# Patient Record
Sex: Female | Born: 1965
Health system: Southern US, Community
[De-identification: ages and names within clinical notes are randomized; demographics above are authoritative.]

## PROBLEM LIST (undated history)

## (undated) DIAGNOSIS — I471 Supraventricular tachycardia, unspecified: Secondary | ICD-10-CM

## (undated) HISTORY — PX: BREAST SURGERY: SHX581

## (undated) HISTORY — DX: Supraventricular tachycardia, unspecified: I47.10

## (undated) HISTORY — PX: ANTERIOR CRUCIATE LIGAMENT REPAIR: SHX115

---

## 1998-02-24 ENCOUNTER — Emergency Department (HOSPITAL_COMMUNITY): Admission: EM | Admit: 1998-02-24 | Discharge: 1998-02-24 | Payer: Self-pay | Admitting: Emergency Medicine

## 1998-02-24 ENCOUNTER — Encounter: Payer: Self-pay | Admitting: Emergency Medicine

## 1998-05-29 ENCOUNTER — Other Ambulatory Visit: Admission: RE | Admit: 1998-05-29 | Discharge: 1998-05-29 | Payer: Self-pay

## 1999-03-04 ENCOUNTER — Emergency Department (HOSPITAL_COMMUNITY): Admission: EM | Admit: 1999-03-04 | Discharge: 1999-03-04 | Payer: Self-pay | Admitting: Emergency Medicine

## 1999-05-28 ENCOUNTER — Other Ambulatory Visit: Admission: RE | Admit: 1999-05-28 | Discharge: 1999-05-28 | Payer: Self-pay | Admitting: Obstetrics and Gynecology

## 2002-09-26 ENCOUNTER — Emergency Department (HOSPITAL_COMMUNITY): Admission: EM | Admit: 2002-09-26 | Discharge: 2002-09-26 | Payer: Self-pay | Admitting: Emergency Medicine

## 2003-03-20 ENCOUNTER — Other Ambulatory Visit: Admission: RE | Admit: 2003-03-20 | Discharge: 2003-03-20 | Payer: Self-pay | Admitting: *Deleted

## 2003-03-20 ENCOUNTER — Other Ambulatory Visit: Admission: RE | Admit: 2003-03-20 | Discharge: 2003-03-20 | Payer: Self-pay | Admitting: Obstetrics and Gynecology

## 2003-05-15 ENCOUNTER — Ambulatory Visit (HOSPITAL_COMMUNITY): Admission: RE | Admit: 2003-05-15 | Discharge: 2003-05-15 | Payer: Self-pay | Admitting: Obstetrics and Gynecology

## 2003-10-18 ENCOUNTER — Inpatient Hospital Stay (HOSPITAL_COMMUNITY): Admission: AD | Admit: 2003-10-18 | Discharge: 2003-10-21 | Payer: Self-pay | Admitting: Obstetrics and Gynecology

## 2004-02-26 ENCOUNTER — Ambulatory Visit (HOSPITAL_COMMUNITY): Admission: RE | Admit: 2004-02-26 | Discharge: 2004-02-26 | Payer: Self-pay | Admitting: Plastic Surgery

## 2004-03-20 ENCOUNTER — Emergency Department (HOSPITAL_COMMUNITY): Admission: EM | Admit: 2004-03-20 | Discharge: 2004-03-20 | Payer: Self-pay | Admitting: Emergency Medicine

## 2004-08-12 ENCOUNTER — Other Ambulatory Visit: Admission: RE | Admit: 2004-08-12 | Discharge: 2004-08-12 | Payer: Self-pay | Admitting: Obstetrics and Gynecology

## 2005-09-09 ENCOUNTER — Other Ambulatory Visit: Admission: RE | Admit: 2005-09-09 | Discharge: 2005-09-09 | Payer: Self-pay | Admitting: Obstetrics and Gynecology

## 2005-12-03 ENCOUNTER — Emergency Department (HOSPITAL_COMMUNITY): Admission: EM | Admit: 2005-12-03 | Discharge: 2005-12-04 | Payer: Self-pay | Admitting: Emergency Medicine

## 2005-12-15 ENCOUNTER — Emergency Department (HOSPITAL_COMMUNITY): Admission: EM | Admit: 2005-12-15 | Discharge: 2005-12-15 | Payer: Self-pay | Admitting: Family Medicine

## 2006-04-01 ENCOUNTER — Emergency Department (HOSPITAL_COMMUNITY): Admission: EM | Admit: 2006-04-01 | Discharge: 2006-04-01 | Payer: Self-pay | Admitting: Emergency Medicine

## 2006-07-29 ENCOUNTER — Emergency Department (HOSPITAL_COMMUNITY): Admission: EM | Admit: 2006-07-29 | Discharge: 2006-07-29 | Payer: Self-pay | Admitting: Emergency Medicine

## 2007-10-25 ENCOUNTER — Ambulatory Visit (HOSPITAL_COMMUNITY): Admission: RE | Admit: 2007-10-25 | Discharge: 2007-10-25 | Payer: Self-pay | Admitting: Obstetrics and Gynecology

## 2008-11-01 ENCOUNTER — Ambulatory Visit (HOSPITAL_COMMUNITY): Admission: RE | Admit: 2008-11-01 | Discharge: 2008-11-01 | Payer: Self-pay | Admitting: Obstetrics & Gynecology

## 2010-03-31 ENCOUNTER — Ambulatory Visit (HOSPITAL_COMMUNITY): Admission: RE | Admit: 2010-03-31 | Discharge: 2010-03-31 | Payer: Self-pay | Admitting: Obstetrics and Gynecology

## 2010-07-06 ENCOUNTER — Encounter: Payer: Self-pay | Admitting: Obstetrics and Gynecology

## 2010-10-31 NOTE — Op Note (Signed)
NAME:  Sue Shields, Sue Shields                    ACCOUNT NO.:  [id number]   MEDICAL RECORD NO.:  [id number]                   PATIENT TYPE:  INP   LOCATION:  9138                                 FACILITY:  [facility]   PHYSICIAN:  Janine Limbo, M.D.            DATE OF BIRTH:  21-Mar-1966   DATE OF PROCEDURE:  10/18/2003  DATE OF DISCHARGE:                                 OPERATIVE REPORT   PREOPERATIVE DIAGNOSES:  1. Term intrauterine pregnancy.  2. Gestational hypertension.  3. Herpes simplex virus outbreak.   POSTOPERATIVE DIAGNOSES:  1. Term intrauterine pregnancy.  2. Gestational hypertension.  3. Herpes simplex virus outbreak.   1. Macrosomia.   PROCEDURE:  Primary low transverse cesarean section.   SURGEON:  Janine Limbo, M.D.   FIRST ASSISTANT:  Concha Pyo. Duplantis, C.N.M.   ANESTHESIA:  Spinal anesthesia.   INDICATIONS FOR PROCEDURE:  Ms. Sue Shields is a 45 year old female,  gravida 1, para 0, who presents at 40 weeks and three days gestation.  She  has been followed at the [facility] of [facility].  This pregnancy has been largely uncomplicated.  The  patient did develop gestational hypertension at the end of her pregnancy.  She has a history of herpes simplex virus and she did have an outbreak  several days ago.  The patient has a history of depression.  The patient  understands the indications for her procedure and she accepts the risks of,  but not limited to, anesthetic complications, bleeding, infections, and  possible damage to the surrounding organs.  The patient elected not to  prolong pregnancy any longer in hopes of resolving the herpes outbreak.   FINDINGS:  A 9 pound 13 ounce female infant Huston Foley) was delivered from the  cephalic presentation. The Apgars were 8 at one minute and 9 at five  minutes.  The uterus, fallopian tubes, and the ovaries were normal for the  gravid state.   DESCRIPTION OF  PROCEDURE:  The patient was taken to the operating room where  a spinal anesthetic was given.  The patient's abdomen and peritoneum were  prepped with multiple layers of Betadine. A  Foley catheter was placed in  the bladder.  The patient was sterilely draped.  The lower abdomen was  injected with 16 mL of 0.5% Marcaine.  A low transverse incision was made  and the incision was carried sharply through the subcutaneous tissue, the  fascia, and the anterior peritoneum.  An incision was made in the lower  uterine segment and the bladder flap was developed.  The incision was  extended in a low transverse fashion on the uterus.  The fetal head was  delivered without difficulty.  A nuchal cord was present.  The remainder of  the infant was delivered.  The cord was clamped and cut and the infant was  handed to the awaiting pediatric team.  Routine cord blood studies were  obtained.  The placenta was removed.  The uterine cavity was cleaned of  amniotic fluid, clotted blood and membranes.  The uterine incision was  closed using a running locking suture of 2-0 Vicryl followed by an  imbricating suture of 2-0 Vicryl.  Hemostasis was adequate.  The pericolonic  gutters were vigorously irrigated. The anterior peritoneum and the abdominal  musculature were reapproximated in the midline using 2-0 Vicryl.  The fascia  was closed using a running suture of 0 Vicryl followed by three interrupted  sutures of 0 Vicryl.  The subcutaneous layer was irrigated.  Hemostasis was  adequate.  A Jackson-Pratt drain was placed in the subcutaneous space and  brought out through the left lower quadrant.  The subcutaneous layer was  closed using 0 Vicryl and the skin was reapproximated using a subcuticular  suture of 3-0 Monopril.  The Jackson-Pratt drain was sutured into place  using 2-0 silk.  Sponge, needle and instrument counts were correct x2.   ESTIMATED BLOOD LOSS:  800 mL.   The patient tolerated the procedure  well.  The patient was taken to the  recovery room in stable condition.  The infant was taken to the full-term  nursery in stable condition.  The patient was noted to drain clear, yellow  urine at the end of her procedure.                                               Janine Limbo, M.D.    AVS/MEDQ  D:  10/18/2003  T:  10/19/2003  Job:  [id number]
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
## 2010-10-31 NOTE — Discharge Summary (Signed)
NAME:  Sue Shields, Sue Shields                    ACCOUNT NO.:  [id number]   MEDICAL RECORD NO.:  [id number]                   PATIENT TYPE:  INP   LOCATION:  9138                                 [facility]ILITY[facility]   PHYSICIAN:  Crist Fat. Rivard, M.D.              DATE OF BIRTH:  [birth year]   DATE OF ADMISSION:  10/18/2003  DATE OF DISCHARGE:  10/21/2003                                 DISCHARGE SUMMARY   ADMISSION DIAGNOSES:  1. Intrauterine pregnancy at term.  2. Gestational hypertension.  3. HSV outbreak.   PROCEDURE:  Primary low transverse Cesarean section.   DISCHARGE DIAGNOSES:  1. Intrauterine pregnancy at term.  2. Gestational hypertension.  3. HSV outbreak.  4. Primary low transverse cesarean section.  5. Macrosomia.   HISTORY OF PRESENT ILLNESS:  Ms. Sue Shields is a 45 year old gravida 1,  para 0 at 40-3/7 weeks who presents following evaluation at the office of  [facility] for HSV outbreak. She  has had some gestational hypertension and in light of the fact that she is  at her due date, decision has been made for primary low transverse cesarean  section at this time. This was discussed with the patient and accepted by  her. On Oct 18, 2003 a primary low transverse cesarean section was performed  by Dr. Marline Backbone with the birth of a 9 pound 13 ounce female infant  named Sue Shields with Apgar scores of 8 at 1 minute and 9 at 5 minutes. The  patient has done well in the postoperative period. Her incision is clean,  dry and intact. Jackson-Pratt drain was removed on the second postoperative  day. Her vital signs have remained stable. She is afebrile. Her hemoglobin  on the first postoperative day was 10.9. Her baby has remained stable and is  breast and bottle feeding. On this, her third postoperative day, she is said  to be in satisfactory condition for discharge.   DISCHARGE INSTRUCTIONS:  Per [facility]  Gynecologic  Services handout.   DISCHARGE MEDICATIONS:  1. Motrin 600 mg p.o. q. 6 hours p.r.n. pain.  2. Tylox 1 to 2 p.o. q. 3 to 4 hours pain.  3. Prenatal vitamins.  4. The patient will start Ortho-Evra for contraception in 2 to 4 weeks.   FOLLOW UP:  The patient will be seen at 6 weeks postpartum at the office of  [facility].    Rica Koyanagi, C.N.M.               Crist Fat Rivard, M.D.   SDM/MEDQ  D:  10/21/2003  T:  10/21/2003  Job:  [id number]

## 2010-12-10 ENCOUNTER — Other Ambulatory Visit (HOSPITAL_COMMUNITY): Payer: Self-pay | Admitting: Obstetrics and Gynecology

## 2010-12-10 ENCOUNTER — Ambulatory Visit (HOSPITAL_COMMUNITY)
Admission: RE | Admit: 2010-12-10 | Discharge: 2010-12-10 | Disposition: A | Discharge status: Home / Self Care | Payer: No Typology Code available for payment source | Source: Ambulatory Visit | Attending: Obstetrics and Gynecology | Admitting: Obstetrics and Gynecology

## 2010-12-10 DIAGNOSIS — N970 Female infertility associated with anovulation: Secondary | ICD-10-CM

## 2010-12-10 DIAGNOSIS — N979 Female infertility, unspecified: Secondary | ICD-10-CM | POA: Insufficient documentation

## 2010-12-11 ENCOUNTER — Ambulatory Visit (HOSPITAL_COMMUNITY): Payer: Self-pay

## 2011-10-14 IMAGING — RF DG HYSTEROGRAM
6 series · 15 of 15 positions shown · IV contrast (omnipaque)
Comparison: none

CLINICAL DATA: Infertility

HYSTEROSALPINGOGRAM
TECHNIQUE: Following cleansing of the cervix and vagina with
Betadine solution, a hysterosalpingogram was performed using a 5-
French hysterosalpingogram catheter and Omnipaque 300 contrast.
The patient tolerated the exam without difficulty.
Fluoroscopy time:  0.7 minutes

[Series 1: run · 10 of 10 slices shown (1 of 6)]
[im 1/10]
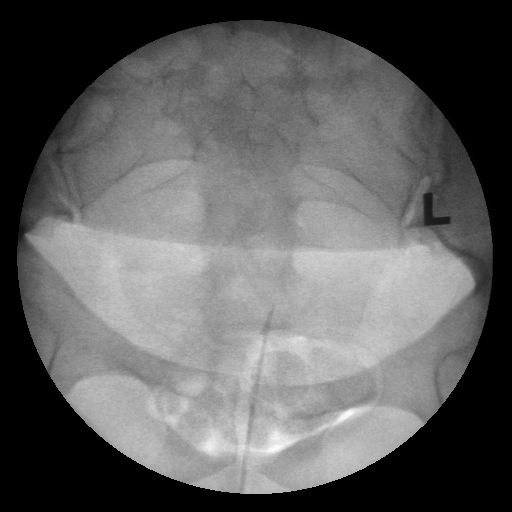
[im 2/10]
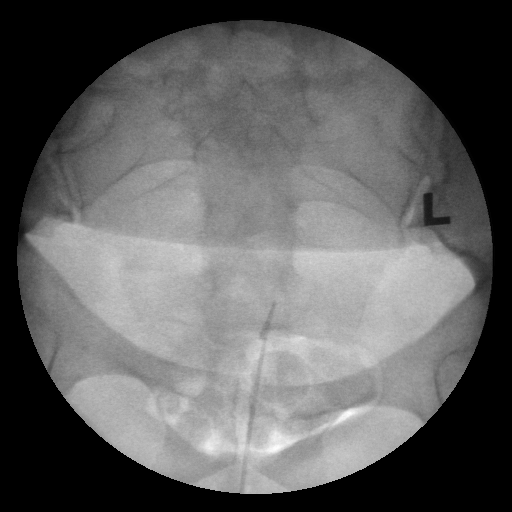
[im 3/10]
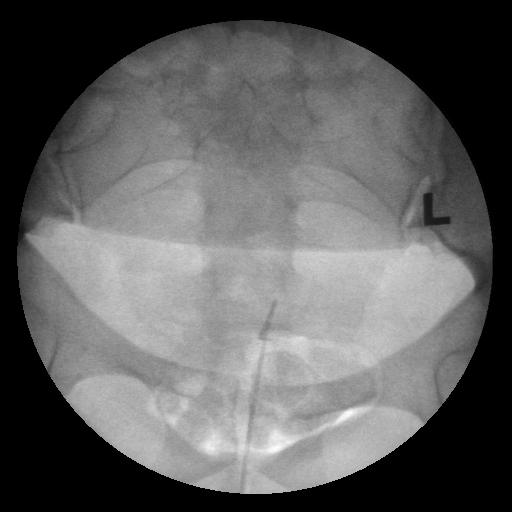
[im 4/10]
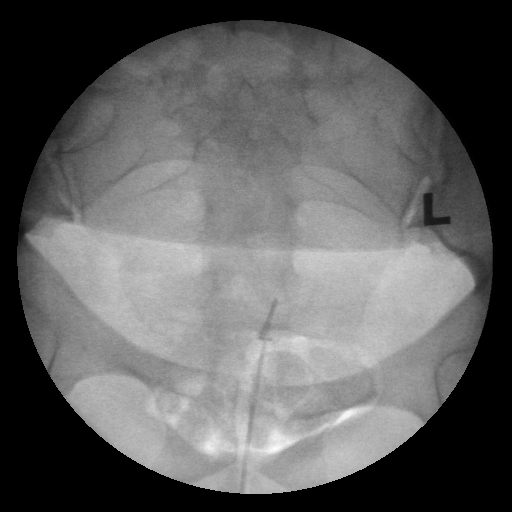
[im 5/10]
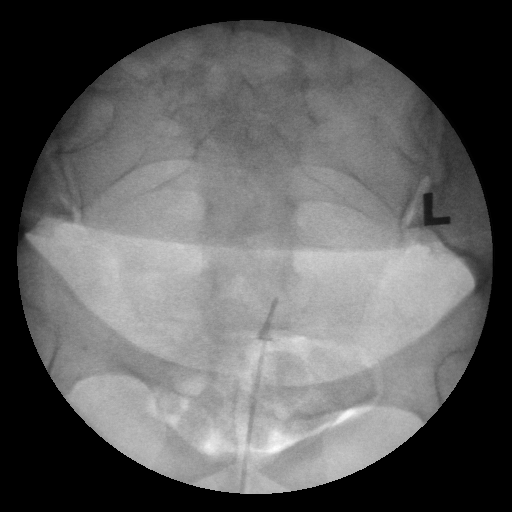
[im 6/10]
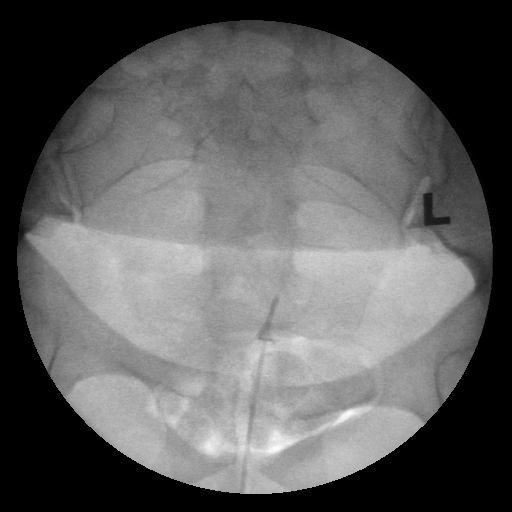
[im 7/10]
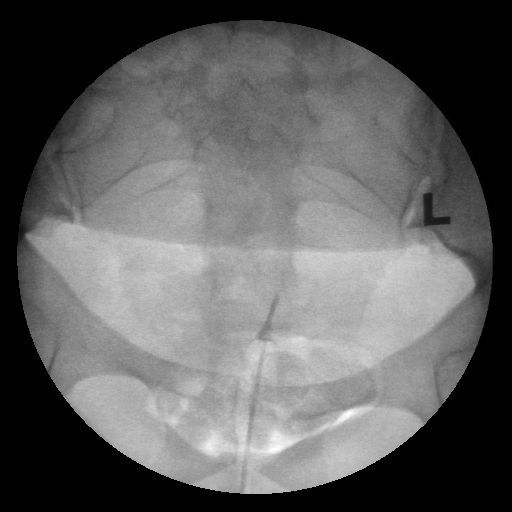
[im 8/10]
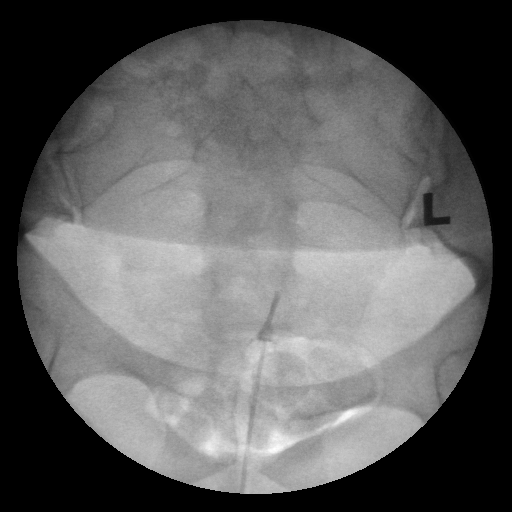
[im 9/10]
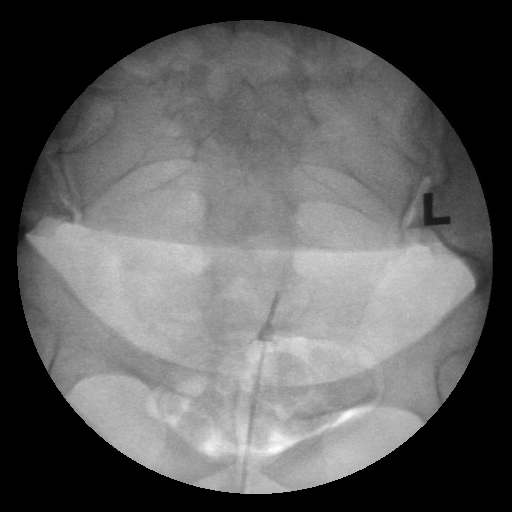
[im 10/10]
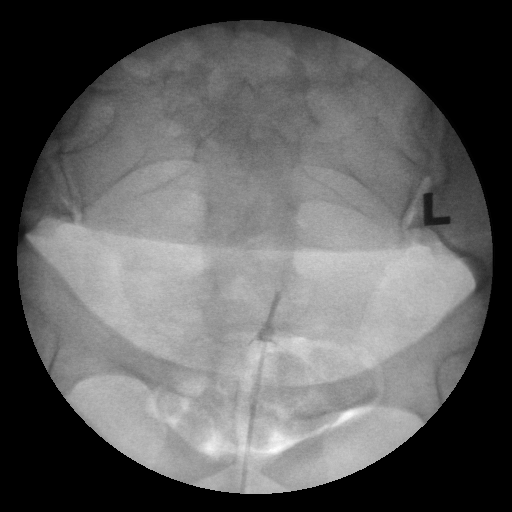

[Series 2: run · 1 of 1 slices shown (2 of 6)]
[im 1/1]
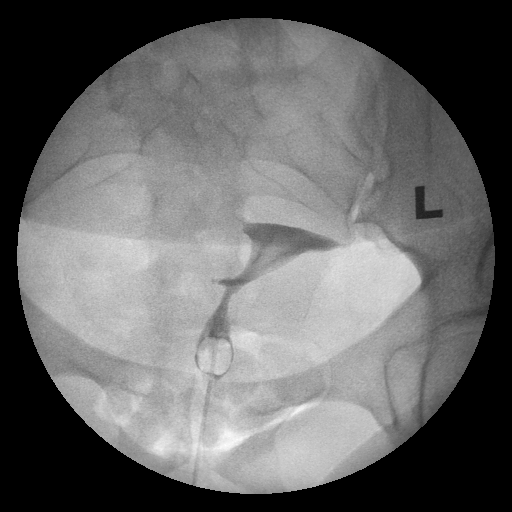

[Series 3: run · 1 of 1 slices shown (3 of 6)]
[im 1/1]
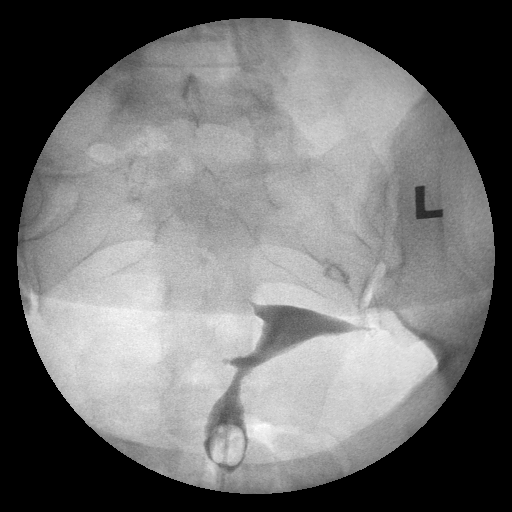

[Series 4: run · 1 of 1 slices shown (4 of 6)]
[im 1/1]
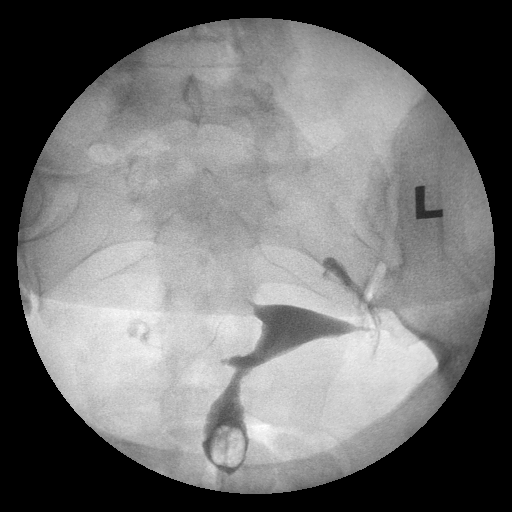

[Series 5: run · 1 of 1 slices shown (5 of 6)]
[im 1/1]
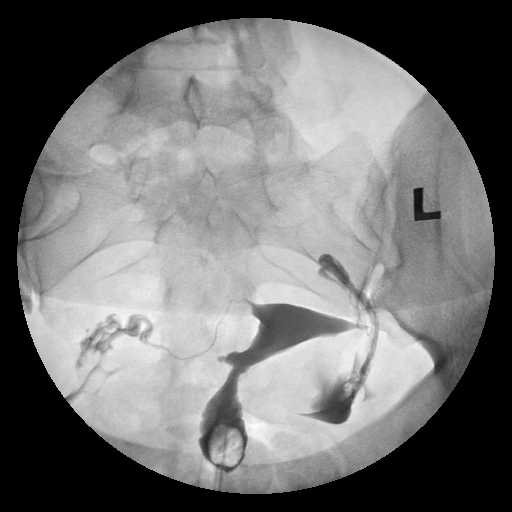

[Series 6: run · 1 of 1 slices shown (6 of 6)]
[im 1/1]
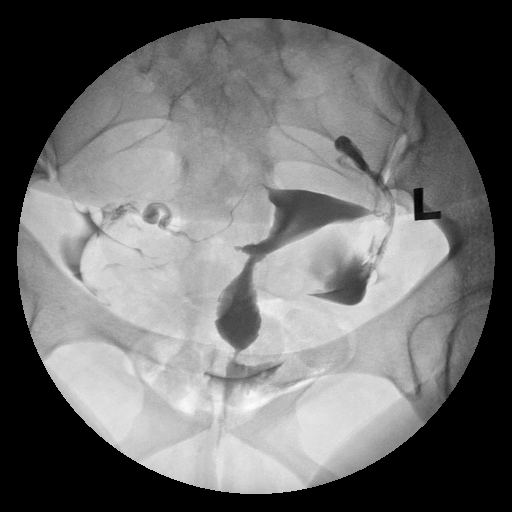

[15 of 15 positions shown; findings below may reference images not displayed]

FINDINGS: The endometrial cavity of the uterus is normal in contour
and appearance.

Contrast filling of both fallopian tubes is seen, and both tubes
are normal in appearance.  Intraperitoneal spill of contrast from
both fallopian tubes is demonstrated.
IMPRESSION: Normal study.  Fallopian tubes are patent bilaterally.

## 2013-01-16 ENCOUNTER — Emergency Department (HOSPITAL_BASED_OUTPATIENT_CLINIC_OR_DEPARTMENT_OTHER)
Admission: EM | Admit: 2013-01-16 | Discharge: 2013-01-16 | Disposition: A | Discharge status: Home / Self Care | Payer: No Typology Code available for payment source | Attending: Emergency Medicine | Admitting: Emergency Medicine

## 2013-01-16 ENCOUNTER — Encounter (HOSPITAL_BASED_OUTPATIENT_CLINIC_OR_DEPARTMENT_OTHER): Payer: Self-pay | Admitting: *Deleted

## 2013-01-16 DIAGNOSIS — J029 Acute pharyngitis, unspecified: Secondary | ICD-10-CM | POA: Insufficient documentation

## 2013-01-16 NOTE — ED Provider Notes (Signed)
History  This chart was scribed for Dagmar Hait, MD by Ardeen Jourdain, ED Scribe. This patient was seen in room MH05/MH05 and the patient's care was started at 1857.  CSN: [id number]     Arrival date & time 01/16/13  1842  First MD Initiated Contact with Patient 01/16/13 1857     Chief Complaint  Patient presents with  . Sore Throat    Patient is a 47 y.o. female presenting with pharyngitis. The history is provided by the patient. No language interpreter was used.  Sore Throat This is a new problem. The current episode started 12 to 24 hours ago. The problem occurs constantly. The problem has not changed since onset.Pertinent negatives include no chest pain, no abdominal pain, no headaches and no shortness of breath. The symptoms are aggravated by swallowing. Nothing relieves the symptoms. She has tried nothing for the symptoms. The treatment provided no relief.    HPI Comments: Criselda Starke is a 47 y.o. female who presents to the Emergency Department complaining of gradual onset, gradually worsening, constant sore throat. She states it feels like her "glands are swollen." She denies taking anything for the pain. She states she is able to eat and drink but the activities aggravate the pain. She denies any fever, ear pain, neck pain, neck stiffness, cough, nausea and emesis as associated symptoms.    No past medical history on file. Past Surgical History  Procedure Laterality Date  . Breast surgery     No family history on file. History  Substance Use Topics  . Smoking status: Not on file  . Smokeless tobacco: Not on file  . Alcohol Use: Not on file   No OB history available.   Review of Systems  HENT: Positive for sore throat.   Respiratory: Negative for shortness of breath.   Cardiovascular: Negative for chest pain.  Gastrointestinal: Negative for abdominal pain.  Neurological: Negative for headaches.  All other systems reviewed and are  negative.    Allergies  Codeine and Sulfa antibiotics  Home Medications  No current outpatient prescriptions on file.  Triage Vitals: BP 113/66  Pulse 70  Temp(Src) 98.2 F (36.8 C) (Oral)  Resp 18  Ht 5\' 7"  (1.702 m)  Wt 180 lb (81.647 kg)  BMI 28.19 kg/m2  SpO2 98%  LMP 01/08/2013  Physical Exam  Nursing note and vitals reviewed. Constitutional: She is oriented to person, place, and time. She appears well-developed and well-nourished. No distress.  HENT:  Head: Normocephalic and atraumatic. No trismus in the jaw.  Mouth/Throat: No oropharyngeal exudate.  Mild erythema on right tonsil   Eyes: Conjunctivae and EOM are normal. Pupils are equal, round, and reactive to light.  Neck: Normal range of motion. Neck supple. No rigidity. No tracheal deviation present.  Mild cervical lymphadenopathy on right. No nuchal rigidity   Cardiovascular: Normal rate, regular rhythm and normal heart sounds.  Exam reveals no gallop and no friction rub.   No murmur heard. Pulmonary/Chest: Effort normal and breath sounds normal. No stridor. No respiratory distress. She has no wheezes. She has no rales. She exhibits no tenderness.  Abdominal: Soft. She exhibits no distension.  Musculoskeletal: Normal range of motion. She exhibits no edema.  Neurological: She is alert and oriented to person, place, and time.  Skin: Skin is warm and dry.  Psychiatric: She has a normal mood and affect. Her behavior is normal.    ED Course   Procedures (including critical care time)  DIAGNOSTIC STUDIES: Oxygen Saturation  is 98% on room air, normal by my interpretation.    COORDINATION OF CARE:  7:20 PM-Discussed treatment plan which includes a rapid strep screen and instructions for home care with pt at bedside and pt agreed to plan.   Results for orders placed during the hospital encounter of 01/16/13  RAPID STREP SCREEN      Result Value Range   Streptococcus, Group A Screen (Direct) NEGATIVE  NEGATIVE     No results found.  1. Sore throat   2. Viral pharyngitis     MDM   and 47 year old female sore throat. No fevers. Rapid strep is negative. Exam and story consistent with a viral pharyngitis. Stable for discharge.   I personally performed the services described in this documentation, which was scribed in my presence. The recorded information has been revi house and sent over from [facility] Probably go down andewed and is accurate.     Dagmar Hait, MD 01/16/13 2330

## 2013-01-16 NOTE — ED Notes (Signed)
MD at bedside. 

## 2013-01-16 NOTE — ED Notes (Signed)
Redness noted in throat area with Pt. Reports she feels "puny".  Pt. Reports she went to her dr. Isidore Moos but wait was too long.

## 2013-01-18 LAB — CULTURE, GROUP A STREP

## 2016-03-14 ENCOUNTER — Ambulatory Visit (HOSPITAL_COMMUNITY)
Admission: EM | Admit: 2016-03-14 | Discharge: 2016-03-14 | Disposition: A | Discharge status: Home / Self Care | Payer: BLUE CROSS/BLUE SHIELD | Attending: Family Medicine | Admitting: Family Medicine

## 2016-03-14 ENCOUNTER — Ambulatory Visit (INDEPENDENT_AMBULATORY_CARE_PROVIDER_SITE_OTHER): Payer: BLUE CROSS/BLUE SHIELD

## 2016-03-14 ENCOUNTER — Encounter (HOSPITAL_COMMUNITY): Payer: Self-pay | Admitting: Emergency Medicine

## 2016-03-14 DIAGNOSIS — S8991XA Unspecified injury of right lower leg, initial encounter: Secondary | ICD-10-CM | POA: Diagnosis not present

## 2016-03-14 DIAGNOSIS — M25561 Pain in right knee: Secondary | ICD-10-CM | POA: Diagnosis not present

## 2016-03-14 NOTE — Discharge Instructions (Signed)
Result of the xray discussed today. [facility]. May take ibuprofen/tylenol for pain if pain is present.

## 2016-03-14 NOTE — ED Triage Notes (Signed)
Jumped and landed on right leg, something feels out of line in knee.

## 2016-03-14 NOTE — ED Provider Notes (Signed)
CSN: [id number]     Arrival date & time 03/14/16  1525 History   First MD Initiated Contact with Patient 03/14/16 1715     Chief Complaint  Patient presents with  . Knee Pain   (Consider location/radiation/quality/duration/timing/severity/associated sxs/prior Treatment) Patient is a 50 y.o female presents today with right foot injury. Injury occurred today at 11:30am. She was at the [employer] this morning and she had to jump over a 4 feet wall, once she jumped over, she landed on her right foot and she "felt something shifted out of her knee". She denies pain. She reports tightness and pressure in her right knee especially when she stands or bends. She also states that it "feels like something is moving inside her right knee". She reports to have full ROM. She is able to bear some weight. When the injury first occurred, she was not immediately ambulatory. She has crutches present in room with her.       History reviewed. No pertinent past medical history. Past Surgical History:  Procedure Laterality Date  . BREAST SURGERY     No family history on file. Social History  Substance Use Topics  . Smoking status: Never Smoker  . Smokeless tobacco: Never Used  . Alcohol use Yes   OB History    No data available     Review of Systems  All other systems reviewed and are negative.   Allergies  Codeine and Sulfa antibiotics  Home Medications   Prior to Admission medications   Not on File   Meds Ordered and Administered this Visit  Medications - No data to display  BP 118/70 (BP Location: Left Arm)   Pulse 74   Temp 98.6 F (37 C) (Oral)   Resp 16   LMP 02/28/2016   SpO2 98%  No data found.   Physical Exam  Constitutional: She is oriented to person, place, and time. She appears well-developed and well-nourished.  HENT:  Head: Normocephalic and atraumatic.  Cardiovascular: Normal rate, regular rhythm and normal heart sounds.   Pulmonary/Chest: Effort normal and  breath sounds normal.  Musculoskeletal:  Right knee has a mild and localized swelling on the lateral aspect of the knee. Right knee has no deformity, has no obvious injury noted, is non-tender, and has full ROM. Sensation intact.  Neurological: She is alert and oriented to person, place, and time.  Skin: Skin is warm and dry.  Nursing note and vitals reviewed.   Urgent Care Course   Clinical Course    Procedures (including critical care time)  Labs Review Labs Reviewed - No data to display  Imaging Review Dg Knee Complete 4 Views Right  Result Date: 03/14/2016 CLINICAL DATA:  Right knee injury yesterday. EXAM: RIGHT KNEE - COMPLETE 4+ VIEW COMPARISON:  None. FINDINGS: Small calcific density overlying the right fibular head no adjacent fracture line. Distal femur and proximal tibia appear intact and normally aligned. Probable small joint effusion within the suprapatellar bursa. IMPRESSION: 1. Probable small joint effusion. 2. No fracture line or osseous dislocation. 3. Tiny calcific density overlying the right fibular head, of uncertain chronicity, too small to definitively characterize. This is near the expected insertion site of the lateral collateral ligament which may indicate underlying ligamentous injury. Given that patient's symptoms are lateral, consider nonemergent MRI for further characterization. Electronically Signed   By: Bary Richard M.D.   On: 03/14/2016 17:43     MDM   1. Right knee pain    Right knee xray  shows no fracture or dislocation. There is a tiny calcific density overlying the right fibular head and there may be an underlying ligamentous injury and MRI is needed for further characterization. [facility]. Informed to call to make an appointment on Monday. Patient has no pain but may start taking ibuprofen or tylenol for pain. All questions answered. Discharge paperwork given.        Lucia EstelleFeng Vaneta Hammontree, NP 03/14/16  1757

## 2016-06-04 DIAGNOSIS — M238X1 Other internal derangements of right knee: Secondary | ICD-10-CM | POA: Diagnosis not present

## 2016-06-13 DIAGNOSIS — M238X1 Other internal derangements of right knee: Secondary | ICD-10-CM | POA: Diagnosis not present

## 2016-06-19 DIAGNOSIS — S83511A Sprain of anterior cruciate ligament of right knee, initial encounter: Secondary | ICD-10-CM | POA: Diagnosis not present

## 2016-07-06 DIAGNOSIS — S83511D Sprain of anterior cruciate ligament of right knee, subsequent encounter: Secondary | ICD-10-CM | POA: Diagnosis not present

## 2016-07-10 DIAGNOSIS — S83511D Sprain of anterior cruciate ligament of right knee, subsequent encounter: Secondary | ICD-10-CM | POA: Diagnosis not present

## 2016-07-14 DIAGNOSIS — S83511D Sprain of anterior cruciate ligament of right knee, subsequent encounter: Secondary | ICD-10-CM | POA: Diagnosis not present

## 2016-07-22 DIAGNOSIS — S83511D Sprain of anterior cruciate ligament of right knee, subsequent encounter: Secondary | ICD-10-CM | POA: Diagnosis not present

## 2016-08-10 DIAGNOSIS — S83511D Sprain of anterior cruciate ligament of right knee, subsequent encounter: Secondary | ICD-10-CM | POA: Diagnosis not present

## 2016-09-25 ENCOUNTER — Ambulatory Visit (HOSPITAL_BASED_OUTPATIENT_CLINIC_OR_DEPARTMENT_OTHER): Admit: 2016-09-25 | Payer: BLUE CROSS/BLUE SHIELD | Admitting: Orthopedic Surgery

## 2016-09-25 ENCOUNTER — Encounter (HOSPITAL_BASED_OUTPATIENT_CLINIC_OR_DEPARTMENT_OTHER): Payer: Self-pay

## 2016-09-25 DIAGNOSIS — M25361 Other instability, right knee: Secondary | ICD-10-CM | POA: Diagnosis not present

## 2016-09-25 DIAGNOSIS — S83511A Sprain of anterior cruciate ligament of right knee, initial encounter: Secondary | ICD-10-CM | POA: Diagnosis not present

## 2016-09-25 DIAGNOSIS — G8918 Other acute postprocedural pain: Secondary | ICD-10-CM | POA: Diagnosis not present

## 2016-09-25 DIAGNOSIS — S83281A Other tear of lateral meniscus, current injury, right knee, initial encounter: Secondary | ICD-10-CM | POA: Diagnosis not present

## 2016-09-25 DIAGNOSIS — S83261A Peripheral tear of lateral meniscus, current injury, right knee, initial encounter: Secondary | ICD-10-CM | POA: Diagnosis not present

## 2016-09-25 DIAGNOSIS — S83211A Bucket-handle tear of medial meniscus, current injury, right knee, initial encounter: Secondary | ICD-10-CM | POA: Diagnosis not present

## 2016-09-25 SURGERY — KNEE ARTHROSCOPY WITH ANTERIOR CRUCIATE LIGAMENT (ACL) REPAIR
Anesthesia: Choice | Site: Knee | Laterality: Right

## 2016-09-28 DIAGNOSIS — S83511D Sprain of anterior cruciate ligament of right knee, subsequent encounter: Secondary | ICD-10-CM | POA: Diagnosis not present

## 2016-10-02 DIAGNOSIS — S83511D Sprain of anterior cruciate ligament of right knee, subsequent encounter: Secondary | ICD-10-CM | POA: Diagnosis not present

## 2016-10-05 DIAGNOSIS — S83511D Sprain of anterior cruciate ligament of right knee, subsequent encounter: Secondary | ICD-10-CM | POA: Diagnosis not present

## 2016-10-13 DIAGNOSIS — S83511A Sprain of anterior cruciate ligament of right knee, initial encounter: Secondary | ICD-10-CM | POA: Diagnosis not present

## 2016-10-16 DIAGNOSIS — S83511D Sprain of anterior cruciate ligament of right knee, subsequent encounter: Secondary | ICD-10-CM | POA: Diagnosis not present

## 2016-10-20 DIAGNOSIS — S83511D Sprain of anterior cruciate ligament of right knee, subsequent encounter: Secondary | ICD-10-CM | POA: Diagnosis not present

## 2016-10-22 DIAGNOSIS — S83511D Sprain of anterior cruciate ligament of right knee, subsequent encounter: Secondary | ICD-10-CM | POA: Diagnosis not present

## 2016-11-03 DIAGNOSIS — S83511D Sprain of anterior cruciate ligament of right knee, subsequent encounter: Secondary | ICD-10-CM | POA: Diagnosis not present

## 2016-11-10 DIAGNOSIS — S83511D Sprain of anterior cruciate ligament of right knee, subsequent encounter: Secondary | ICD-10-CM | POA: Diagnosis not present

## 2016-11-13 DIAGNOSIS — S83511D Sprain of anterior cruciate ligament of right knee, subsequent encounter: Secondary | ICD-10-CM | POA: Diagnosis not present

## 2016-11-20 DIAGNOSIS — S83511D Sprain of anterior cruciate ligament of right knee, subsequent encounter: Secondary | ICD-10-CM | POA: Diagnosis not present

## 2016-11-24 DIAGNOSIS — S83511D Sprain of anterior cruciate ligament of right knee, subsequent encounter: Secondary | ICD-10-CM | POA: Diagnosis not present

## 2016-11-26 DIAGNOSIS — S83511D Sprain of anterior cruciate ligament of right knee, subsequent encounter: Secondary | ICD-10-CM | POA: Diagnosis not present

## 2016-12-01 DIAGNOSIS — S83511D Sprain of anterior cruciate ligament of right knee, subsequent encounter: Secondary | ICD-10-CM | POA: Diagnosis not present

## 2016-12-04 DIAGNOSIS — S83511D Sprain of anterior cruciate ligament of right knee, subsequent encounter: Secondary | ICD-10-CM | POA: Diagnosis not present

## 2016-12-07 DIAGNOSIS — Z6828 Body mass index (BMI) 28.0-28.9, adult: Secondary | ICD-10-CM | POA: Diagnosis not present

## 2016-12-07 DIAGNOSIS — Z01419 Encounter for gynecological examination (general) (routine) without abnormal findings: Secondary | ICD-10-CM | POA: Diagnosis not present

## 2016-12-10 DIAGNOSIS — S83511D Sprain of anterior cruciate ligament of right knee, subsequent encounter: Secondary | ICD-10-CM | POA: Diagnosis not present

## 2016-12-14 DIAGNOSIS — S83511D Sprain of anterior cruciate ligament of right knee, subsequent encounter: Secondary | ICD-10-CM | POA: Diagnosis not present

## 2016-12-17 DIAGNOSIS — S83511D Sprain of anterior cruciate ligament of right knee, subsequent encounter: Secondary | ICD-10-CM | POA: Diagnosis not present

## 2016-12-21 DIAGNOSIS — S83511D Sprain of anterior cruciate ligament of right knee, subsequent encounter: Secondary | ICD-10-CM | POA: Diagnosis not present

## 2016-12-28 DIAGNOSIS — S83511D Sprain of anterior cruciate ligament of right knee, subsequent encounter: Secondary | ICD-10-CM | POA: Diagnosis not present

## 2016-12-31 DIAGNOSIS — S83511D Sprain of anterior cruciate ligament of right knee, subsequent encounter: Secondary | ICD-10-CM | POA: Diagnosis not present

## 2017-02-26 DIAGNOSIS — S83511D Sprain of anterior cruciate ligament of right knee, subsequent encounter: Secondary | ICD-10-CM | POA: Diagnosis not present

## 2017-02-26 DIAGNOSIS — Z4789 Encounter for other orthopedic aftercare: Secondary | ICD-10-CM | POA: Diagnosis not present

## 2017-06-04 DIAGNOSIS — M5416 Radiculopathy, lumbar region: Secondary | ICD-10-CM | POA: Diagnosis not present

## 2017-06-04 DIAGNOSIS — M545 Low back pain: Secondary | ICD-10-CM | POA: Diagnosis not present

## 2017-09-21 DIAGNOSIS — M23611 Other spontaneous disruption of anterior cruciate ligament of right knee: Secondary | ICD-10-CM | POA: Diagnosis not present

## 2017-09-21 DIAGNOSIS — Z9889 Other specified postprocedural states: Secondary | ICD-10-CM | POA: Diagnosis not present

## 2017-12-07 ENCOUNTER — Emergency Department (HOSPITAL_COMMUNITY)
Admission: EM | Admit: 2017-12-07 | Discharge: 2017-12-07 | Disposition: A | Discharge status: Home / Self Care | Payer: BLUE CROSS/BLUE SHIELD | Attending: Emergency Medicine | Admitting: Emergency Medicine

## 2017-12-07 ENCOUNTER — Other Ambulatory Visit: Payer: Self-pay

## 2017-12-07 ENCOUNTER — Encounter (HOSPITAL_COMMUNITY): Payer: Self-pay

## 2017-12-07 DIAGNOSIS — G43909 Migraine, unspecified, not intractable, without status migrainosus: Secondary | ICD-10-CM | POA: Diagnosis not present

## 2017-12-07 DIAGNOSIS — G43009 Migraine without aura, not intractable, without status migrainosus: Secondary | ICD-10-CM | POA: Diagnosis not present

## 2017-12-07 DIAGNOSIS — R51 Headache: Secondary | ICD-10-CM | POA: Diagnosis not present

## 2017-12-07 MED ORDER — METOCLOPRAMIDE HCL 10 MG PO TABS
5.0000 mg | ORAL_TABLET | Freq: Once | ORAL | Status: AC
  Administered 2017-12-07: 5 mg via ORAL
  Filled 2017-12-07: qty 1

## 2017-12-07 MED ORDER — DIPHENHYDRAMINE HCL 25 MG PO CAPS
25.0000 mg | ORAL_CAPSULE | Freq: Once | ORAL | Status: AC
  Administered 2017-12-07: 25 mg via ORAL
  Filled 2017-12-07: qty 1

## 2017-12-07 MED ORDER — KETOROLAC TROMETHAMINE 30 MG/ML IJ SOLN
30.0000 mg | Freq: Once | INTRAMUSCULAR | Status: AC
  Administered 2017-12-07: 30 mg via INTRAVENOUS
  Filled 2017-12-07: qty 1

## 2017-12-07 MED ORDER — SODIUM CHLORIDE 0.9 % IV BOLUS
1000.0000 mL | Freq: Once | INTRAVENOUS | Status: AC
  Administered 2017-12-07: 1000 mL via INTRAVENOUS

## 2017-12-07 NOTE — Discharge Instructions (Addendum)
Follow up with PCP for reevaluation of your symptoms. Please return to the ED if headache worsen, or symptoms such as persistent vomiting, high fevers, new weakness present.

## 2017-12-07 NOTE — ED Notes (Signed)
Pt verbalizes understanding of d/c instructions. Pt ambulatory at d/c with all belongings and with family.   

## 2017-12-07 NOTE — ED Provider Notes (Signed)
[facility] EMERGENCY DEPARTMENT Provider Note   CSN: [id number] Arrival date & time: 12/07/17  1650     History   Chief Complaint Chief Complaint  Patient presents with  . Migraine    HPI Sue Shields is a 52 y.o. female.  Sue Shields is a 52 y.o female with no PMH who complains of a headache that began 4 days ago. She describes the pain as a constant throbbing around her whole head. She admits to some nausea, dizzines and 3 episodes of vomiting today along with photophobia.Patient has not been able to eat today, as the nausea is severe. She states having a similar episode 10 years ago and this feeling "just like it".She has taken ibuprofen, Mucinex yesterday but had no relieve in symptoms. She denies CP, SOB, neck rigidity or abdominal pain or recent trauma.         History reviewed. No pertinent past medical history.  There are no active problems to display for this patient.   Past Surgical History:  Procedure Laterality Date  . BREAST SURGERY       OB History   None      Home Medications    Prior to Admission medications   Not on File    Family History No family history on file.  Social History Social History   Tobacco Use  . Smoking status: Never Smoker  . Smokeless tobacco: Never Used  Substance Use Topics  . Alcohol use: Yes  . Drug use: No     Allergies   Codeine and Sulfa antibiotics   Review of Systems Review of Systems  Constitutional: Negative for fever.  Eyes: Positive for photophobia.  Respiratory: Negative for shortness of breath.   Cardiovascular: Negative for chest pain.  Gastrointestinal: Negative for abdominal pain.  Musculoskeletal: Negative for neck pain and neck stiffness.  Neurological: Positive for dizziness (mild ) and headaches (throbbing). Negative for syncope, facial asymmetry, weakness and light-headedness.  All other systems reviewed and are negative.    Physical  Exam Updated Vital Signs BP 113/77 (BP Location: Right Arm)   Pulse (!) 103   Temp 98 F (36.7 C) (Oral)   Resp 18   Ht 5\' 7"  (1.702 m)   Wt 81.6 kg (180 lb)   LMP 11/23/2017   SpO2 98%   BMI 28.19 kg/m   Physical Exam  Constitutional: She is oriented to person, place, and time. She appears well-developed and well-nourished.  Eyes: Right pupil is reactive. Left pupil is reactive.  Cardiovascular: Normal heart sounds.  No murmur heard. Pulmonary/Chest: Effort normal and breath sounds normal.  Abdominal: Soft. Bowel sounds are normal. There is no tenderness.  Neurological: She is alert and oriented to person, place, and time.  Skin: Skin is warm and dry.  Nursing note and vitals reviewed.    ED Treatments / Results  Labs (all labs ordered are listed, but only abnormal results are displayed) Labs Reviewed - No data to display  EKG None  Radiology No results found.  Procedures Procedures (including critical care time)  Medications Ordered in ED Medications - No data to display   Initial Impression / Assessment and Plan / ED Course  I have reviewed the triage vital signs and the nursing notes.  Pertinent labs & imaging results that were available during my care of the patient were reviewed by me and considered in my medical decision making (see chart for details).     Patient presented with migraine headache x  3 days, she was very uncomfortable, mildly tachycardic and afebrile. She was treated with Toradol,reglan, and benadryl which made her headache go down from a 10 to a 4. Patient is stable resting in the room. Explained to patient that we are unable to start her on prophylaxis medication for migraines but advised to follow up with her PCP. Patient agrees and understands treatment plan.  Final Clinical Impressions(s) / ED Diagnoses   Final diagnoses:  None    ED Discharge Orders    None       Freddy JakschSoto, Kizzie Cotten, PA-C 12/07/17 Tawana Scale2005    Kohut, Stephen,  MD 12/08/17 1253

## 2017-12-07 NOTE — ED Notes (Signed)
ED Provider at bedside. 

## 2017-12-07 NOTE — ED Triage Notes (Signed)
Pt c/o migraine that started Friday, reports that she does not get them regularly. Reporting that this one started Friday with light sensitivity, NV, and dizziness. Pt has taken ibuprofen and musinex D, with no relief.

## 2017-12-09 DIAGNOSIS — G43909 Migraine, unspecified, not intractable, without status migrainosus: Secondary | ICD-10-CM | POA: Diagnosis not present

## 2018-01-11 DIAGNOSIS — Z01419 Encounter for gynecological examination (general) (routine) without abnormal findings: Secondary | ICD-10-CM | POA: Diagnosis not present

## 2018-01-11 DIAGNOSIS — Z6829 Body mass index (BMI) 29.0-29.9, adult: Secondary | ICD-10-CM | POA: Diagnosis not present

## 2018-01-19 DIAGNOSIS — Z1329 Encounter for screening for other suspected endocrine disorder: Secondary | ICD-10-CM | POA: Diagnosis not present

## 2018-01-19 DIAGNOSIS — Z131 Encounter for screening for diabetes mellitus: Secondary | ICD-10-CM | POA: Diagnosis not present

## 2018-01-19 DIAGNOSIS — Z803 Family history of malignant neoplasm of breast: Secondary | ICD-10-CM | POA: Diagnosis not present

## 2018-01-19 DIAGNOSIS — Z13220 Encounter for screening for lipoid disorders: Secondary | ICD-10-CM | POA: Diagnosis not present

## 2018-01-19 DIAGNOSIS — Z1321 Encounter for screening for nutritional disorder: Secondary | ICD-10-CM | POA: Diagnosis not present

## 2018-03-20 DIAGNOSIS — Z1212 Encounter for screening for malignant neoplasm of rectum: Secondary | ICD-10-CM | POA: Diagnosis not present

## 2018-03-20 DIAGNOSIS — Z1211 Encounter for screening for malignant neoplasm of colon: Secondary | ICD-10-CM | POA: Diagnosis not present

## 2018-04-17 ENCOUNTER — Encounter (HOSPITAL_COMMUNITY): Payer: Self-pay | Admitting: *Deleted

## 2018-04-17 ENCOUNTER — Ambulatory Visit (HOSPITAL_COMMUNITY)
Admission: EM | Admit: 2018-04-17 | Discharge: 2018-04-17 | Disposition: A | Discharge status: Home / Self Care | Payer: BLUE CROSS/BLUE SHIELD | Attending: Family Medicine | Admitting: Family Medicine

## 2018-04-17 ENCOUNTER — Other Ambulatory Visit: Payer: Self-pay

## 2018-04-17 DIAGNOSIS — H1032 Unspecified acute conjunctivitis, left eye: Secondary | ICD-10-CM | POA: Diagnosis not present

## 2018-04-17 MED ORDER — POLYMYXIN B-TRIMETHOPRIM 10000-0.1 UNIT/ML-% OP SOLN: 1.0000 [drp] | OPHTHALMIC | 0 refills | Status: AC

## 2018-04-17 MED ORDER — KETOTIFEN FUMARATE 0.025 % OP SOLN: 1.0000 [drp] | Freq: Two times a day (BID) | OPHTHALMIC | 0 refills | Status: DC

## 2018-04-17 NOTE — ED Provider Notes (Signed)
[facility]    CSN: [id number] Arrival date & time: 04/17/18  1134     History   Chief Complaint Chief Complaint  Patient presents with  . Eye Problem    HPI Sue Shields is a 52 y.o. female.   Sue Shields presents with complaints of left eye irritation which started late last night, primarily this morning. Sensation that there is something in the eye and some tearing. Mild redness. No mattering or thick discharge. Slight itching. No known exposure or anything which could have gotten in eye. No injury. No URI symptoms. No known ill contacts. Hasn't tried any treatments for symptoms. No vision change. No eye ball or lid pain. No light sensitivity. Doesn't wear contacts or glasses- only readers prn. Without contributing medical history.      ROS per HPI.      History reviewed. No pertinent past medical history.  There are no active problems to display for this patient.   Past Surgical History:  Procedure Laterality Date  . ANTERIOR CRUCIATE LIGAMENT REPAIR     x2  . BREAST SURGERY    . CESAREAN SECTION      OB History   None      Home Medications    Prior to Admission medications   Medication Sig Start Date End Date Taking? Authorizing Provider  ketotifen (ZADITOR) 0.025 % ophthalmic solution Place 1 drop into the left eye 2 (two) times daily. Start today 11/3 for allergic symptoms 04/17/18   Linus Mako B, NP  trimethoprim-polymyxin b (POLYTRIM) ophthalmic solution Place 1 drop into the left eye every 4 (four) hours for 5 days. If worsening of symptoms may start 11/4 04/18/18 04/23/18  Georgetta Haber, NP    Family History Family History  Problem Relation Age of Onset  . Cancer Mother   . Diabetes Father     Social History Social History   Tobacco Use  . Smoking status: Never Smoker  . Smokeless tobacco: Never Used  Substance Use Topics  . Alcohol use: Yes    Comment: occasionally  . Drug use: Not Currently     Allergies     Codeine and Sulfa antibiotics   Review of Systems Review of Systems   Physical Exam Triage Vital Signs ED Triage Vitals  Enc Vitals Group     BP 04/17/18 1235 115/67     Pulse Rate 04/17/18 1235 (!) 56     Resp 04/17/18 1235 14     Temp 04/17/18 1235 (!) 97.4 F (36.3 C)     Temp Source 04/17/18 1235 Oral     SpO2 04/17/18 1235 100 %     Weight --      Height --      Head Circumference --      Peak Flow --      Pain Score 04/17/18 1238 0     Pain Loc --      Pain Edu? --      Excl. in GC? --    No data found.  Updated Vital Signs BP 115/67   Pulse (!) 56   Temp (!) 97.4 F (36.3 C) (Oral)   Resp 14   LMP 03/26/2018 (Exact Date)   SpO2 100%   Visual Acuity Right Eye Distance:   Left Eye Distance:   Bilateral Distance:    Right Eye Near: R Near: 20/50 without corrective lens Left Eye Near:  L Near: 20/40 without corrective lens Bilateral Near:  20/40 without corrective lens  Physical Exam  Constitutional: She is oriented to person, place, and time. She appears well-developed and well-nourished. No distress.  Eyes: Pupils are equal, round, and reactive to light. EOM and lids are normal. Right eye exhibits no chemosis, no discharge, no exudate and no hordeolum. No foreign body present in the right eye. Left eye exhibits no chemosis, no discharge, no exudate and no hordeolum. No foreign body present in the left eye. No scleral icterus.  Very slight injection to left conjunctiva noted  Cardiovascular: Normal rate, regular rhythm and normal heart sounds.  Pulmonary/Chest: Effort normal and breath sounds normal.  Neurological: She is alert and oriented to person, place, and time.  Skin: Skin is warm and dry.     UC Treatments / Results  Labs (all labs ordered are listed, but only abnormal results are displayed) Labs Reviewed - No data to display  EKG None  Radiology No results found.  Procedures Procedures (including critical care time)  Medications  Ordered in UC Medications - No data to display  Initial Impression / Assessment and Plan / UC Course  I have reviewed the triage vital signs and the nursing notes.  Pertinent labs & imaging results that were available during my care of the patient were reviewed by me and considered in my medical decision making (see chart for details).     fluorescein exam deferred at this time- no pain, no injury, no foreign body, no vision changes. Onset of symptoms this morning. Very minimal redness and some tearing to left eye. Discussed with patient at length bacterial vs viral vs allergic conjunctivitis. Patient is very concerned about bacterial conjunctivitis. Encouraged trial of supportive cares with moisturizing drops and zaditor today, if no improvement or if worsening may start polytrim tomorrow. Return precautions provided. Follow up with eye doctor prn. Patient verbalized understanding and agreeable to plan.    Final Clinical Impressions(s) / UC Diagnoses   Final diagnoses:  Acute conjunctivitis of left eye, unspecified acute conjunctivitis type     Discharge Instructions     Less likely to be bacterial conjunctivitis at this time causing your symptoms.  May start Ketotifen twice a day drops, as well as Saline/Visine OTC moisturizing drops as needed for symptoms today.  If develop worsening of redness, irritation, pus thick discharge or drainage may start Polytrim antibiotic drops tomorrow or Tuesday.  If worsening of symptoms, vision change, eye ball pain, fevers, or otherwise worsening while using treatment please follow up with eye doctor.  Avoid rubbing of the eye.    ED Prescriptions    Medication Sig Dispense Auth. Provider   trimethoprim-polymyxin b (POLYTRIM) ophthalmic solution Place 1 drop into the left eye every 4 (four) hours for 5 days. If worsening of symptoms may start 11/4 10 mL Linus Mako B, NP   ketotifen (ZADITOR) 0.025 % ophthalmic solution Place 1 drop into the  left eye 2 (two) times daily. Start today 11/3 for allergic symptoms 5 mL Linus Mako B, NP     Controlled Substance Prescriptions [place] Controlled Substance Registry consulted? Not Applicable   Georgetta Haber, NP 04/17/18 1307

## 2018-04-17 NOTE — Discharge Instructions (Signed)
Less likely to be bacterial conjunctivitis at this time causing your symptoms.  May start Ketotifen twice a day drops, as well as Saline/Visine OTC moisturizing drops as needed for symptoms today.  If develop worsening of redness, irritation, pus thick discharge or drainage may start Polytrim antibiotic drops tomorrow or Tuesday.  If worsening of symptoms, vision change, eye ball pain, fevers, or otherwise worsening while using treatment please follow up with eye doctor.  Avoid rubbing of the eye.

## 2018-04-17 NOTE — ED Triage Notes (Signed)
C/O left eye redness and sensation of foreign body and some swelling today.  Does not wear contact lenses.

## 2018-06-10 DIAGNOSIS — N959 Unspecified menopausal and perimenopausal disorder: Secondary | ICD-10-CM | POA: Diagnosis not present

## 2020-12-07 ENCOUNTER — Ambulatory Visit
Admission: EM | Admit: 2020-12-07 | Discharge: 2020-12-07 | Disposition: A | Discharge status: Home / Self Care | Payer: 59 | Attending: Emergency Medicine | Admitting: Emergency Medicine

## 2020-12-07 ENCOUNTER — Other Ambulatory Visit: Payer: Self-pay

## 2020-12-07 DIAGNOSIS — H60392 Other infective otitis externa, left ear: Secondary | ICD-10-CM | POA: Diagnosis not present

## 2020-12-07 MED ORDER — NEOMYCIN-POLYMYXIN-HC 3.5-10000-1 OT SUSP: 4.0000 [drp] | Freq: Four times a day (QID) | OTIC | 0 refills | Status: AC

## 2020-12-07 NOTE — ED Triage Notes (Signed)
Onset yesterday of left ear pain. Denies URI sxs. No recent swimming. No meds taken.

## 2020-12-07 NOTE — ED Provider Notes (Signed)
[facility]    CSN: [id number] Arrival date & time: 12/07/20  0841      History   Chief Complaint Chief Complaint  Patient presents with   Otalgia    Left     HPI Sue Shields is a 55 y.o. female presenting today for evaluation of ear pain.  Reports left ear pain and swelling over the past 1 to 2 days.  Denies fevers.  Denies recent swimming.  Does report using Q-tips to clean ears daily.  Denies recent URI symptoms.  Denies sore throat.  HPI  History reviewed. No pertinent past medical history.  There are no problems to display for this patient.   Past Surgical History:  Procedure Laterality Date   ANTERIOR CRUCIATE LIGAMENT REPAIR     x2   BREAST SURGERY     CESAREAN SECTION      OB History   No obstetric history on file.      Home Medications    Prior to Admission medications   Medication Sig Start Date End Date Taking? Authorizing Provider  neomycin-polymyxin-hydrocortisone (CORTISPORIN) 3.5-10000-1 OTIC suspension Place 4 drops into the left ear 4 (four) times daily for 7 days. 12/07/20 12/14/20 Yes Finis Hendricksen, Junius Creamer, PA-C    Family History Family History  Problem Relation Age of Onset   Cancer Mother    Diabetes Father     Social History Social History   Tobacco Use   Smoking status: Never   Smokeless tobacco: Never  Vaping Use   Vaping Use: Never used  Substance Use Topics   Alcohol use: Yes    Comment: occasionally   Drug use: Not Currently     Allergies   Codeine and Sulfa antibiotics   Review of Systems Review of Systems  Constitutional:  Negative for activity change, appetite change, chills, fatigue and fever.  HENT:  Positive for ear pain. Negative for congestion, rhinorrhea, sinus pressure, sore throat and trouble swallowing.   Eyes:  Negative for discharge and redness.  Respiratory:  Negative for cough, chest tightness and shortness of breath.   Cardiovascular:  Negative for chest pain.  Gastrointestinal:   Negative for abdominal pain, diarrhea, nausea and vomiting.  Musculoskeletal:  Negative for myalgias.  Skin:  Negative for rash.  Neurological:  Negative for dizziness, light-headedness and headaches.    Physical Exam Triage Vital Signs ED Triage Vitals  Enc Vitals Group     BP      Pulse      Resp      Temp      Temp src      SpO2      Weight      Height      Head Circumference      Peak Flow      Pain Score      Pain Loc      Pain Edu?      Excl. in GC?    No data found.  Updated Vital Signs BP 128/72 (BP Location: Left Arm)   Pulse 75   Temp 97.9 F (36.6 C) (Oral)   Resp 18   LMP 03/26/2018 (Exact Date)   SpO2 95%   Visual Acuity Right Eye Distance:   Left Eye Distance:   Bilateral Distance:    Right Eye Near:   Left Eye Near:    Bilateral Near:     Physical Exam Vitals and nursing note reviewed.  Constitutional:      Appearance: She is well-developed.  Comments: No acute distress  HENT:     Head: Normocephalic and atraumatic.     Ears:     Comments: Left external auricle without tenderness to palpation, mild tenderness to palpation of tragus, EAC is swollen and erythematous, TM largely visualized and with good bony landmarks and cone of light, no erythema  Right canal and TM without abnormality    Nose: Nose normal.     Comments: Bilateral nares patent    Mouth/Throat:     Comments: Oral mucosa pink and moist, no tonsillar enlargement or exudate. Posterior pharynx patent and nonerythematous, no uvula deviation or swelling. Normal phonation.  Eyes:     Conjunctiva/sclera: Conjunctivae normal.  Cardiovascular:     Rate and Rhythm: Normal rate.  Pulmonary:     Effort: Pulmonary effort is normal. No respiratory distress.  Abdominal:     General: There is no distension.  Musculoskeletal:        General: Normal range of motion.     Cervical back: Neck supple.  Skin:    General: Skin is warm and dry.  Neurological:     Mental Status: She is  alert and oriented to person, place, and time.     UC Treatments / Results  Labs (all labs ordered are listed, but only abnormal results are displayed) Labs Reviewed - No data to display  EKG   Radiology No results found.  Procedures Procedures (including critical care time)  Medications Ordered in UC Medications - No data to display  Initial Impression / Assessment and Plan / UC Course  I have reviewed the triage vital signs and the nursing notes.  Pertinent labs & imaging results that were available during my care of the patient were reviewed by me and considered in my medical decision making (see chart for details).    Left otitis externa-treating with Cortisporin, no signs of otitis media at this time, Tylenol and ibuprofen for pain, keep ear clean and dry, monitor for gradual resolution, follow-up if not improving or worsening.  Discussed strict return precautions. Patient verbalized understanding and is agreeable with plan.  Final Clinical Impressions(s) / UC Diagnoses   Final diagnoses:  Infective otitis externa of left ear     Discharge Instructions      Use Cortisporin eardrops 4 times daily x1 week Tylenol and ibuprofen for pain and swelling Keep ear clean and dry Follow-up within 3 to 4 days if not seeing any improvement with the above     ED Prescriptions     Medication Sig Dispense Auth. Provider   neomycin-polymyxin-hydrocortisone (CORTISPORIN) 3.5-10000-1 OTIC suspension Place 4 drops into the left ear 4 (four) times daily for 7 days. 10 mL Coy Roch[place], [place] C, PA-C      PDMP not reviewed this encounter.   Lew Dawes, PA-C 12/07/20 1008

## 2020-12-07 NOTE — Discharge Instructions (Addendum)
Use Cortisporin eardrops 4 times daily x1 week Tylenol and ibuprofen for pain and swelling Keep ear clean and dry Follow-up within 3 to 4 days if not seeing any improvement with the above

## 2020-12-25 ENCOUNTER — Ambulatory Visit
Admission: EM | Admit: 2020-12-25 | Discharge: 2020-12-25 | Disposition: A | Discharge status: Home / Self Care | Payer: 59 | Attending: Urgent Care | Admitting: Urgent Care

## 2020-12-25 DIAGNOSIS — H1032 Unspecified acute conjunctivitis, left eye: Secondary | ICD-10-CM | POA: Diagnosis not present

## 2020-12-25 MED ORDER — TOBRAMYCIN 0.3 % OP SOLN: 1.0000 [drp] | OPHTHALMIC | 0 refills | Status: DC

## 2020-12-25 NOTE — ED Triage Notes (Signed)
Onset this morning of left eye redness with drainage. Denies any visual changes but notes photophobia. No meds taken. No right eye sxs. Pt was seen on 6/25 for otalgia. She is requesting an ear recheck.

## 2020-12-25 NOTE — ED Provider Notes (Signed)
  [facility]   MRN: [id number] DOB: [birth year]  Subjective:   Sue Shields is a 55 y.o. female presenting for 1 day history of acute onset left eye redness, tearing, irritation.  Denies actual pain, vision changes, eye trauma, eyelid swelling, facial pain, photophobia.  Does not wear contact lenses.  No current facility-administered medications for this encounter. No current outpatient medications on file.   Allergies  Allergen Reactions   Codeine    Sulfa Antibiotics     No past medical history on file.   Past Surgical History:  Procedure Laterality Date   ANTERIOR CRUCIATE LIGAMENT REPAIR     x2   BREAST SURGERY     CESAREAN SECTION      Family History  Problem Relation Age of Onset   Cancer Mother    Diabetes Father     Social History   Tobacco Use   Smoking status: Never   Smokeless tobacco: Never  Vaping Use   Vaping Use: Never used  Substance Use Topics   Alcohol use: Yes    Comment: occasionally   Drug use: Not Currently    ROS   Objective:   Vitals: BP 118/75 (BP Location: Left Arm)   Pulse 84   Temp 98.3 F (36.8 C) (Oral)   Resp 18   LMP 03/26/2018 (Exact Date)   SpO2 94%   Physical Exam Constitutional:      General: She is not in acute distress.    Appearance: Normal appearance. She is well-developed. She is not ill-appearing.  HENT:     Head: Normocephalic and atraumatic.     Nose: Nose normal.     Mouth/Throat:     Mouth: Mucous membranes are moist.     Pharynx: Oropharynx is clear.  Eyes:     General: Lids are everted, no foreign bodies appreciated. No scleral icterus.       Left eye: Discharge (clear and watery) present.No foreign body or hordeolum.     Extraocular Movements: Extraocular movements intact.     Left eye: Normal extraocular motion.     Conjunctiva/sclera:     Left eye: Left conjunctiva is injected. No chemosis, exudate or hemorrhage.    Pupils: Pupils are equal, round, and reactive to  light.  Cardiovascular:     Rate and Rhythm: Normal rate.  Pulmonary:     Effort: Pulmonary effort is normal.  Skin:    General: Skin is warm and dry.  Neurological:     General: No focal deficit present.     Mental Status: She is alert and oriented to person, place, and time.  Psychiatric:        Mood and Affect: Mood normal.        Behavior: Behavior normal.      Assessment and Plan :   PDMP not reviewed this encounter.  1. Acute bacterial conjunctivitis of left eye     Start tobramycin for bacterial conjunctivitis. Counseled patient on potential for adverse effects with medications prescribed/recommended today, ER and return-to-clinic precautions discussed, patient verbalized understanding.    Wallis Bamberg, [place] 12/25/20 1955

## 2021-05-08 LAB — COLOGUARD: COLOGUARD: NEGATIVE

## 2022-05-21 ENCOUNTER — Encounter (HOSPITAL_COMMUNITY): Payer: Self-pay

## 2022-05-21 ENCOUNTER — Other Ambulatory Visit: Payer: Self-pay

## 2022-05-21 ENCOUNTER — Emergency Department (HOSPITAL_COMMUNITY)
Admission: EM | Admit: 2022-05-21 | Discharge: 2022-05-21 | Disposition: A | Discharge status: Home / Self Care | Payer: 59 | Attending: Emergency Medicine | Admitting: Emergency Medicine

## 2022-05-21 DIAGNOSIS — R002 Palpitations: Secondary | ICD-10-CM | POA: Diagnosis present

## 2022-05-21 DIAGNOSIS — I471 Supraventricular tachycardia, unspecified: Secondary | ICD-10-CM

## 2022-05-21 DIAGNOSIS — R000 Tachycardia, unspecified: Secondary | ICD-10-CM | POA: Insufficient documentation

## 2022-05-21 LAB — CBC WITH DIFFERENTIAL/PLATELET
Abs Immature Granulocytes: 0.03 10*3/uL (ref 0.00–0.07)
Basophils Absolute: 0.1 10*3/uL (ref 0.0–0.1)
Basophils Relative: 1 %
Eosinophils Absolute: 0.2 10*3/uL (ref 0.0–0.5)
Eosinophils Relative: 2 %
HCT: 42.4 % (ref 36.0–46.0)
Hemoglobin: 13.6 g/dL (ref 12.0–15.0)
Immature Granulocytes: 0 %
Lymphocytes Relative: 33 %
Lymphs Abs: 3.1 10*3/uL (ref 0.7–4.0)
MCH: 29 pg (ref 26.0–34.0)
MCHC: 32.1 g/dL (ref 30.0–36.0)
MCV: 90.4 fL (ref 80.0–100.0)
Monocytes Absolute: 0.7 10*3/uL (ref 0.1–1.0)
Monocytes Relative: 7 %
Neutro Abs: 5.3 10*3/uL (ref 1.7–7.7)
Neutrophils Relative %: 57 %
Platelets: 271 10*3/uL (ref 150–400)
RBC: 4.69 MIL/uL (ref 3.87–5.11)
RDW: 13.5 % (ref 11.5–15.5)
WBC: 9.3 10*3/uL (ref 4.0–10.5)
nRBC: 0 % (ref 0.0–0.2)

## 2022-05-21 LAB — BASIC METABOLIC PANEL
Anion gap: 11 (ref 5–15)
BUN: 16 mg/dL (ref 6–20)
CO2: 22 mmol/L (ref 22–32)
Calcium: 9.3 mg/dL (ref 8.9–10.3)
Chloride: 106 mmol/L (ref 98–111)
Creatinine, Ser: 0.76 mg/dL (ref 0.44–1.00)
GFR, Estimated: >60 mL/min (ref >60)
Glucose, Bld: 175 mg/dL — ABNORMAL HIGH (ref 70–99)
Potassium: 3.6 mmol/L (ref 3.5–5.1)
Sodium: 139 mmol/L (ref 135–145)

## 2022-05-21 MED ORDER — METOPROLOL TARTRATE 5 MG/5ML IV SOLN
5.0000 mg | Freq: Once | INTRAVENOUS | Status: AC
  Administered 2022-05-21: 5 mg via INTRAVENOUS
  Filled 2022-05-21: qty 5

## 2022-05-21 MED ORDER — SODIUM CHLORIDE 0.9 % IV BOLUS
1000.0000 mL | Freq: Once | INTRAVENOUS | Status: AC
  Administered 2022-05-21: 1000 mL via INTRAVENOUS

## 2022-05-21 NOTE — ED Triage Notes (Signed)
EMS notified when [place] pulled into firestation. Pt reports hx of previous episodes. Pt HR in the 220's. Upon arrival pt ambulated self to restroom without difficulty or distress.

## 2022-05-21 NOTE — Discharge Instructions (Addendum)
We are placing a referral for you to be able to see a cardiologist.

## 2022-05-21 NOTE — ED Provider Notes (Signed)
  [facility] EMERGENCY DEPARTMENT Provider Note   CSN: [id number] Arrival date & time: 05/21/22  1108     History  Chief Complaint  Patient presents with   Palpitations    Sue Shields is a 56 y.o. female.  The history is provided by the patient. No language interpreter was used.  Palpitations 56 year old female with history of paroxysmal SVT presenting with sudden onset palpitations occurred earlier today.  Patient brought in by EMS, heart rate in the 220s initially.  Patient denies any vomiting or diarrhea.  She does drink "a lot" of coffee.  She does not follow with a cardiologist regularly.     Home Medications Prior to Admission medications   Not on File      Allergies    Codeine and Sulfa antibiotics    Review of Systems   Review of Systems  Cardiovascular:  Positive for palpitations.    Physical Exam Updated Vital Signs BP (!) 111/55   Pulse 82   Temp (!) 97.5 F (36.4 C) (Oral)   Resp (!) 28   Ht 5\' 7"  (1.702 m)   Wt 90.7 kg   LMP 03/26/2018 (Exact Date)   SpO2 100%   BMI 31.32 kg/m  Physical Exam Constitutional:      General: She is not in acute distress.    Appearance: Normal appearance.     Comments: Uncomfortable appearing  HENT:     Head: Normocephalic and atraumatic.  Cardiovascular:     Rate and Rhythm: Regular rhythm. Tachycardia present.  Pulmonary:     Effort: Pulmonary effort is normal. No respiratory distress.  Musculoskeletal:     Cervical back: Neck supple.  Neurological:     Mental Status: She is alert.    ED Results / Procedures / Treatments   Labs (all labs ordered are listed, but only abnormal results are displayed) Labs Reviewed  CBC WITH DIFFERENTIAL/PLATELET  BASIC METABOLIC PANEL    EKG None  Radiology No results found.  Procedures Procedures    Medications Ordered in ED Medications  sodium chloride 0.9 % bolus 1,000 mL (1,000 mLs Intravenous New Bag/Given 05/21/22 1129)   metoprolol tartrate (LOPRESSOR) injection 5 mg (5 mg Intravenous Given 05/21/22 1129)  metoprolol tartrate (LOPRESSOR) injection 5 mg (5 mg Intravenous Given 05/21/22 1149)    ED Course/ Medical Decision Making/ A&P                           Medical Decision Making Amount and/or Complexity of Data Reviewed Labs: ordered.  Risk Prescription drug management.   56 year old female with history of paroxysmal SVT presenting with significant elevated heart rate in the 200s consistent with SVT as reviewed on EKG here.  Patient is normotensive and hemodynamically stable.  Patient was given IV metoprolol tartrate 5 mg x 2 with successful termination of SVT.  CBC and BMP obtained without any significant abnormalities or electrolyte derangements.  Patient remains in normal sinus rhythm, will discharge at this time with referral to cardiology.  Final Clinical Impression(s) / ED Diagnoses Final diagnoses:  None    Rx / DC Orders ED Discharge Orders     None         59, MD 05/21/22 1327    14/07/23, DO 05/22/22 [phone]

## 2022-06-01 ENCOUNTER — Telehealth: Payer: Self-pay

## 2022-06-01 ENCOUNTER — Encounter: Payer: Self-pay | Admitting: Cardiovascular Disease

## 2022-06-01 ENCOUNTER — Ambulatory Visit: Payer: BC Managed Care – PPO | Attending: Cardiovascular Disease | Admitting: Cardiovascular Disease

## 2022-06-01 VITALS — BP 122/78 | HR 75 | Ht 67.0 in | Wt 200.0 lb

## 2022-06-01 DIAGNOSIS — I341 Nonrheumatic mitral (valve) prolapse: Secondary | ICD-10-CM

## 2022-06-01 DIAGNOSIS — Z79899 Other long term (current) drug therapy: Secondary | ICD-10-CM

## 2022-06-01 DIAGNOSIS — E669 Obesity, unspecified: Secondary | ICD-10-CM

## 2022-06-01 DIAGNOSIS — I471 Supraventricular tachycardia, unspecified: Secondary | ICD-10-CM

## 2022-06-01 MED ORDER — VERAPAMIL HCL ER 180 MG PO TBCR: 180.0000 mg | EXTENDED_RELEASE_TABLET | Freq: Every day | ORAL | 3 refills | Status: DC

## 2022-06-01 NOTE — Progress Notes (Signed)
     Cardiology Office Note    Date:  06/01/2022   ID:  Sue Shields, DOB 10/04/1965, MRN [id number]  PCP:  Ross, Charles Alan, MD  Cardiologist:  Thomas Kelly, MD   New cardiology evaluation referred by Dr. Dan Floyd following the patient's emergency room visit with SVT.   History of Present Illness:  Sue Shields is a 56 y.o. female who admits to a longstanding history of supraventricular tachycardia dating to childhood.  She recalls her first episode when she was approximately 56 years old.  Over the past 40 years, she admits to rare to occasional episodes of brief increased heart rate.  She apparently was evaluated in [place] around 1988 and was told of possibly having mitral valve prolapse.  She recalls an episode in the 1990s when her heart rate increased to 187 bpm.  She typically experiences severe increase in heart rate episodes approximately every decade.  Recently, she admits to being under increased stress and pressure at work.  She drinks 2 cups of coffee with caffeine usually in the morning and may have a rare small soft drink every 1 or 2 days.  On May 21, 2022 while at work heart rate abruptly increased to the upper 100s.  She presented to [facility] and states her heart rate on the monitor was up to 225.  A twelve-lead ECG shows sinus tachycardia 190 bpm with RSR' in V2.  Her rhythm was converted following metoprolol IV 5 mg x 2 with restoration of sinus rhythm.  She underwent laboratory which showed a potassium of 3.6.  Creatinine was 0.76.  CBC was stable with hemoglobin 13.6 hematocrit 42.4.  She is now referred for cardiology consultation.  Presently, she feels well.  She admits to approximately a 40 pound weight gain over the past 15 years.  She does not routinely exercise.  She admits to recent increased work-related stress.  She states her blood pressure typically runs on the low side.  She denies any presyncope or syncope.  She denies any exertional  precipitation of chest pain.  She believes she is sleeping well.  She presents for evaluation.   Past medical history is notable for possible mitral valve prolapse.    Past surgical history is notable and that she is status post breast reduction, 2 knee surgeries and a C-section.  Past Surgical History:  Procedure Laterality Date   ANTERIOR CRUCIATE LIGAMENT REPAIR     x2   BREAST SURGERY     CESAREAN SECTION      Current Medications: No outpatient medications prior to visit.   No facility-administered medications prior to visit.     Allergies:   Codeine and Sulfa antibiotics   Social History   Socioeconomic History   Marital status: Married    Spouse name: Not on file   Number of children: Not on file   Years of education: Not on file   Highest education level: Not on file  Occupational History   Not on file  Tobacco Use   Smoking status: Never   Smokeless tobacco: Never  Vaping Use   Vaping Use: Never used  Substance and Sexual Activity   Alcohol use: Yes    Comment: occasionally   Drug use: Not Currently   Sexual activity: Not on file  Other Topics Concern   Not on file  Social History Narrative   Not on file   Social Determinants of Health   Financial Resource Strain: Not on file    Food Insecurity: Not on file  Transportation Needs: Not on file  Physical Activity: Not on file  Stress: Not on file  Social Connections: Not on file    Social history is notable that she was born in [place].  After her parents were divorced when she was 10 she moved to [place].  She has been living in [place] since September 1988.  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  She is married for 19 years and has 1 child age 33.  Family History:  The patient's family history includes Cancer in her mother; Diabetes in her father.  Her mother died at age 65 with ovarian and endometrial cancer.  Father died at age 65 and may have had interstitial  fibrosis.  She has 1 sister age 38.  ROS General: Negative; No fevers, chills, or night sweats;  HEENT: Negative; No changes in vision or hearing, sinus congestion, difficulty swallowing Pulmonary: Negative; No cough, wheezing, shortness of breath, hemoptysis Cardiovascular: See HPI GI: Negative; No nausea, vomiting, diarrhea, or abdominal pain GU: Negative; No dysuria, hematuria, or difficulty voiding Musculoskeletal: Negative; no myalgias, joint pain, or weakness Hematologic/Oncology: Negative; no easy bruising, bleeding Endocrine: Negative; no heat/cold intolerance; no diabetes Neuro: Negative; no changes in balance, headaches Skin: Negative; No rashes or skin lesions Psychiatric: Negative; No behavioral problems, depression Sleep: Negative; No snoring, daytime sleepiness, hypersomnolence, bruxism, restless legs, hypnogognic hallucinations, no cataplexy Other comprehensive 14 point system review is negative.   PHYSICAL EXAM:   VS:  BP 122/78   Pulse 75   Ht 5' 7" (1.702 m)   Wt 200 lb (90.7 kg)   LMP 03/26/2018 (Exact Date)   SpO2 95%   BMI 31.32 kg/m     Repeat blood pressure me was 112/76.  Wt Readings from Last 3 Encounters:  06/01/22 200 lb (90.7 kg)  05/21/22 200 lb (90.7 kg)  12/07/17 180 lb (81.6 kg)    General: Alert, oriented, no distress.  Mild obesity Skin: normal turgor, no rashes, warm and dry HEENT: Normocephalic, atraumatic. Pupils equal round and reactive to light; sclera anicteric; extraocular muscles intact;  Nose without nasal septal hypertrophy Mouth/Parynx benign; Mallinpatti scale 3 Neck: No JVD, no carotid bruits; normal carotid upstroke Lungs: clear to ausculatation and percussion; no wheezing or rales Chest wall: without tenderness to palpitation Heart: PMI not displaced, RRR, s1 s2 normal, 1/6 systolic murmur lower sternal border, no diastolic murmur, no obvious click,no rubs, gallops, thrills, or heaves Abdomen: soft, nontender; no  hepatosplenomehaly, BS+; abdominal aorta nontender and not dilated by palpation. Back: no CVA tenderness Pulses 2+ Musculoskeletal: full range of motion, normal strength, no joint deformities Extremities: no clubbing cyanosis or edema, Homan's sign negative  Neurologic: grossly nonfocal; Cranial nerves grossly wnl Psychologic: Normal mood and affect   Studies/Labs Reviewed:   June 01, 2022 ECG (independently read by me): NSR at 72, IRBBB  May 21, 2022 ECG (independently read by me): SVT at 190, RVR in V2  Recent Labs:    Latest Ref Rng & Units 05/21/2022   11:22 AM  BMP  Glucose 70 - 99 mg/dL 175   BUN 6 - 20 mg/dL 16   Creatinine 0.44 - 1.00 mg/dL 0.76   Sodium 135 - 145 mmol/L 139   Potassium 3.5 - 5.1 mmol/L 3.6   Chloride 98 - 111 mmol/L 106   CO2 22 - 32 mmol/L 22   Calcium 8.9 - 10.3 mg/dL 9.3  No data to display             Latest Ref Rng & Units 05/21/2022   11:22 AM  CBC  WBC 4.0 - 10.5 K/uL 9.3   Hemoglobin 12.0 - 15.0 g/dL 13.6   Hematocrit 36.0 - 46.0 % 42.4   Platelets 150 - 400 K/uL 271    Lab Results  Component Value Date   MCV 90.4 05/21/2022   No results found for: "TSH" No results found for: "HGBA1C"   BNP No results found for: "BNP"  ProBNP No results found for: "PROBNP"   Lipid Panel  No results found for: "CHOL", "TRIG", "HDL", "CHOLHDL", "VLDL", "LDLCALC", "LDLDIRECT", "LABVLDL"   RADIOLOGY: No results found.   Additional studies/ records that were reviewed today include:  I reviewed the records from [place] [facility] on May 21, 2022.  ASSESSMENT:    1. SVT (supraventricular tachycardia)   2. Mitral valve prolapse   3. Medication management   4. Mild obesity     PLAN:  Ms. Sue Shields is a very pleasant 56-year-old female who has been experiencing episodic bursts of SVT for most of her life with her initial episode at age 9.  Most of her episodes are very short-lived and may occur once every 3 to  4 years.  Over the years she has had 4-5 episodes where her heart rate had rapidly increased and was sustained.  Her most recent episode occurred while at work under increased work-related stress.  She had had 2 cups of coffee that morning.  Her heart rate abruptly increased and upon arrival to the ER by her report heart rate was 225 on the monitor.  Her ECG showed SVT at 190 with RSR' in V2.  She converted to sinus rhythm following IV metoprolol 5 mg x 2.  Since her ER evaluation, she is unaware of any breakthrough tachycardic episodes or arrhythmia.  Her ECG today shows normal sinus rhythm at 72 bpm.  There is no evidence for preexcitation.  PR interval is 132 ms.  QTc interval 440 ms.  There is incomplete right bundle branch block.  I had a very lengthy discussion with her today in the office.  Presently I am scheduling her to undergo a 2D echo Doppler study for reassessment of LV systolic and diastolic function and valvular architecture.  In the ER she had a be met in CBC.  I am recommending follow-up laboratory with a comprehensive metabolic panel, magnesium level, TSH, and fasting lipids.  She states her blood pressure typically runs on the lowish side.  She does not want any medication potentially to cause fatigability.  I have elected to initiate verapamil SR 180 mg to take at bedtime.  With her longstanding history, I have suggested she see Dr. Greg Taylor for an initial EP evaluation and discussion concerning medical therapy versus potential need for future SVT ablation.  She admits to a 40 pound weight gain over the past 15 years and intends to significantly change her diet and initiate weight loss.  We discussed the importance of avoiding caffeinated beverage.  I will contact her regarding her echo and laboratory results and we will see her back in follow-up following her EP evaluation and potential treatment.  Medication Adjustments/Labs and Tests Ordered: Current medicines are reviewed at length  with the patient today.  Concerns regarding medicines are outlined above.  Medication changes, Labs and Tests ordered today are listed in the Patient Instructions below. Patient Instructions  Medication Instructions:    Start Verapamil SR 180 mg at bedtime  *If you need a refill on your cardiac medications before your next appointment, please call your pharmacy*   Lab Work: Your physician recommends that you return for lab work in 1-2 weeks Fasting Lipid panel, CMP, TSH, Magnesium    If you have labs (blood work) drawn today and your tests are completely normal, you will receive your results only by: [place] (if you have [place]) OR A paper copy in the mail If you have any lab test that is abnormal or we need to change your treatment, we will call you to review the results.   Testing/Procedures: Your physician has requested that you have an echocardiogram. Echocardiography is a painless test that uses sound waves to create images of your heart. It provides your doctor with information about the size and shape of your heart and how well your heart's chambers and valves are working. This procedure takes approximately one hour. There are no restrictions for this procedure. Please do NOT wear cologne, perfume, aftershave, or lotions (deodorant is allowed). Please arrive 15 minutes prior to your appointment time.    Follow-Up: At [facility], you and your health needs are our priority.  As part of our continuing mission to provide you with exceptional heart care, we have created designated Provider Care Teams.  These Care Teams include your primary Cardiologist (physician) and Advanced Practice Providers (APPs -  Physician Assistants and Nurse Practitioners) who all work together to provide you with the care you need, when you need it.  We recommend signing up for the patient portal called "MyChart".  Sign up information is provided on this After Visit Summary.  MyChart is used  to connect with patients for Virtual Visits (Telemedicine).  Patients are able to view lab/test results, encounter notes, upcoming appointments, etc.  Non-urgent messages can be sent to your provider as well.   To learn more about what you can do with MyChart, go to [email or web address].    Your next appointment:    Next available   The format for your next appointment:   In Person  Provider:   Crissie Sickles, MD   2-3 months Dr. Claiborne Billings Other Instructions   Important Information About Sugar         Signed, Shelva Majestic, MD  06/01/2022 5:57 PM    [place] [address], [place], [place], [place]  [address] Phone: [phone]

## 2022-06-01 NOTE — Patient Instructions (Addendum)
Medication Instructions:  Start Verapamil SR 180 mg at bedtime  *If you need a refill on your cardiac medications before your next appointment, please call your pharmacy*   Lab Work: Your physician recommends that you return for lab work in 1-2 weeks Fasting Lipid panel, CMP, TSH, Magnesium    If you have labs (blood work) drawn today and your tests are completely normal, you will receive your results only by: MyChart Message (if you have MyChart) OR A paper copy in the mail If you have any lab test that is abnormal or we need to change your treatment, we will call you to review the results.   Testing/Procedures: Your physician has requested that you have an echocardiogram. Echocardiography is a painless test that uses sound waves to create images of your heart. It provides your doctor with information about the size and shape of your heart and how well your heart's chambers and valves are working. This procedure takes approximately one hour. There are no restrictions for this procedure. Please do NOT wear cologne, perfume, aftershave, or lotions (deodorant is allowed). Please arrive 15 minutes prior to your appointment time.    Follow-Up: At [facility], you and your health needs are our priority.  As part of our continuing mission to provide you with exceptional heart care, we have created designated Provider Care Teams.  These Care Teams include your primary Cardiologist (physician) and Advanced Practice Providers (APPs -  Physician Assistants and Nurse Practitioners) who all work together to provide you with the care you need, when you need it.  We recommend signing up for the patient portal called "MyChart".  Sign up information is provided on this After Visit Summary.  MyChart is used to connect with patients for Virtual Visits (Telemedicine).  Patients are able to view lab/test results, encounter notes, upcoming appointments, etc.  Non-urgent messages can be sent to your  provider as well.   To learn more about what you can do with MyChart, go to [email or web address].    Your next appointment:    Next available   The format for your next appointment:   In Person  Provider:   Sharrell Ku, MD   2-3 months Dr. Tresa Endo Other Instructions   Important Information About Sugar

## 2022-06-03 NOTE — Telephone Encounter (Signed)
Opened in error

## 2022-06-05 LAB — LIPID PANEL
Chol/HDL Ratio: 2.9 ratio (ref 0.0–4.4)
Cholesterol, Total: 169 mg/dL (ref 100–199)
HDL: 58 mg/dL (ref >39)
LDL Chol Calc (NIH): 96 mg/dL (ref 0–99)
Triglycerides: 78 mg/dL (ref 0–149)
VLDL Cholesterol Cal: 15 mg/dL (ref 5–40)

## 2022-06-05 LAB — COMPREHENSIVE METABOLIC PANEL
ALT: 15 IU/L (ref 0–32)
AST: 13 IU/L (ref 0–40)
Albumin/Globulin Ratio: 1.8 (ref 1.2–2.2)
Albumin: 4.2 g/dL (ref 3.8–4.9)
Alkaline Phosphatase: 86 IU/L (ref 44–121)
BUN/Creatinine Ratio: 23 (ref 9–23)
BUN: 15 mg/dL (ref 6–24)
Bilirubin Total: 0.3 mg/dL (ref 0.0–1.2)
CO2: 25 mmol/L (ref 20–29)
Calcium: 9.3 mg/dL (ref 8.7–10.2)
Chloride: 106 mmol/L (ref 96–106)
Creatinine, Ser: 0.65 mg/dL (ref 0.57–1.00)
Globulin, Total: 2.4 g/dL (ref 1.5–4.5)
Glucose: 112 mg/dL — ABNORMAL HIGH (ref 70–99)
Potassium: 4.8 mmol/L (ref 3.5–5.2)
Sodium: 143 mmol/L (ref 134–144)
Total Protein: 6.6 g/dL (ref 6.0–8.5)
eGFR: 105 mL/min/{1.73_m2} (ref >59)

## 2022-06-05 LAB — MAGNESIUM: Magnesium: 2.2 mg/dL (ref 1.6–2.3)

## 2022-06-05 LAB — TSH: TSH: 3.17 u[IU]/mL (ref 0.450–4.500)

## 2022-06-10 ENCOUNTER — Telehealth: Payer: Self-pay | Admitting: Cardiovascular Disease

## 2022-06-10 NOTE — Telephone Encounter (Signed)
New Message:     Patient would like to change from Dr  Tresa Endo to Dr Allyson Sabal services. Is tha alrigt with both of you?

## 2022-06-14 NOTE — Telephone Encounter (Signed)
ok 

## 2022-06-30 ENCOUNTER — Ambulatory Visit (HOSPITAL_COMMUNITY): Payer: 59 | Attending: Cardiovascular Disease

## 2022-06-30 DIAGNOSIS — I471 Supraventricular tachycardia, unspecified: Secondary | ICD-10-CM

## 2022-06-30 LAB — ECHOCARDIOGRAM COMPLETE
Area-P 1/2: 3.03 cm2
S' Lateral: 3.4 cm

## 2022-07-13 ENCOUNTER — Ambulatory Visit: Payer: 59 | Attending: Nurse Practitioner | Admitting: Nurse Practitioner

## 2022-07-13 ENCOUNTER — Encounter: Payer: Self-pay | Admitting: Nurse Practitioner

## 2022-07-13 VITALS — BP 122/68 | HR 83 | Ht 67.0 in | Wt 209.0 lb

## 2022-07-13 DIAGNOSIS — I340 Nonrheumatic mitral (valve) insufficiency: Secondary | ICD-10-CM | POA: Diagnosis not present

## 2022-07-13 DIAGNOSIS — E669 Obesity, unspecified: Secondary | ICD-10-CM

## 2022-07-13 DIAGNOSIS — I471 Supraventricular tachycardia, unspecified: Secondary | ICD-10-CM

## 2022-07-13 DIAGNOSIS — R002 Palpitations: Secondary | ICD-10-CM

## 2022-07-13 NOTE — Patient Instructions (Signed)
Medication Instructions:  Your physician recommends that you continue on your current medications as directed. Please refer to the Current Medication list given to you today.   *If you need a refill on your cardiac medications before your next appointment, please call your pharmacy*   Lab Work: NONE ordered at this time of appointment   If you have labs (blood work) drawn today and your tests are completely normal, you will receive your results only by: MyChart Message (if you have MyChart) OR A paper copy in the mail If you have any lab test that is abnormal or we need to change your treatment, we will call you to review the results.   Testing/Procedures: NONE ordered at this time of appointment     Follow-Up: At [facility], you and your health needs are our priority.  As part of our continuing mission to provide you with exceptional heart care, we have created designated Provider Care Teams.  These Care Teams include your primary Cardiologist (physician) and Advanced Practice Providers (APPs -  Physician Assistants and Nurse Practitioners) who all work together to provide you with the care you need, when you need it.  We recommend signing up for the patient portal called "MyChart".  Sign up information is provided on this After Visit Summary.  MyChart is used to connect with patients for Virtual Visits (Telemedicine).  Patients are able to view lab/test results, encounter notes, upcoming appointments, etc.  Non-urgent messages can be sent to your provider as well.   To learn more about what you can do with MyChart, go to [email or web address].    Your next appointment:   4-6 month(s)  Provider:   Jonathan Berry, MD     Other Instructions  

## 2022-07-13 NOTE — Progress Notes (Signed)
Office Visit    Patient Name: Sue Shields Date of Encounter: 07/13/2022  Primary Care Provider:  Lawerance Cruel, MD Primary Cardiologist:  Quay Burow, MD  Chief Complaint    57 year old female with a history of SVT, mitral valve prolapse/regurgitation, and mild obesity who presents for follow-up related to SVT.  Past Medical History    History reviewed. No pertinent past medical history. Past Surgical History:  Procedure Laterality Date   ANTERIOR CRUCIATE LIGAMENT REPAIR     x2   BREAST SURGERY     CESAREAN SECTION      Allergies  Allergies  Allergen Reactions   Codeine    Sulfa Antibiotics      Labs/Other Studies Reviewed    The following studies were reviewed today: Echo 06/30/2022: IMPRESSIONS    1. Left ventricular ejection fraction, by estimation, is 55 to 60%. The  left ventricle has normal function. The left ventricle has no regional  wall motion abnormalities. Left ventricular diastolic parameters were  normal. The average left ventricular  global longitudinal strain is -22.3 %. The global longitudinal strain is  normal.   2. Right ventricular systolic function is normal. The right ventricular  size is normal. There is normal pulmonary artery systolic pressure. The  estimated right ventricular systolic pressure is 47.4 mmHg.   3. The mitral valve is normal in structure. Trivial mitral valve  regurgitation. No evidence of mitral stenosis.   4. The aortic valve is tricuspid. Aortic valve regurgitation is not  visualized. No aortic stenosis is present.   5. The inferior vena cava is dilated in size with >50% respiratory  variability, suggesting right atrial pressure of 8 mmHg.   Recent Labs: 05/21/2022: Hemoglobin 13.6; Platelets 271 06/05/2022: ALT 15; BUN 15; Creatinine, Ser 0.65; Magnesium 2.2; Potassium 4.8; Sodium 143; TSH 3.170  Recent Lipid Panel    Component Value Date/Time   CHOL 169 06/05/2022 0853   TRIG 78 06/05/2022  0853   HDL 58 06/05/2022 0853   CHOLHDL 2.9 06/05/2022 0853   LDLCALC 96 06/05/2022 0853    History of Present Illness    57 year old female with the above past medical history including SVT, mitral valve prolapse/reugurgiation, and mild obesity.  She has a longstanding history of SVT dating back to childhood with occasional episodes of brief increased heart rate.  She was told she had mitral valve prolapse around 1988 during prior cardiac evaluation.  She presented to urgent care in 05/2022 with elevated heart rate.  She responded to IV metoprolol.  She was referred to cardiology.  She was last seen in the office on 06/01/2022 and was stable from a cardiac standpoint.  She was started on verapamil.  Echocardiogram revealed EF 55 to 60%, normal LV function, normal RV systolic function, trivial mitral valve regurgitation.  Given longstanding history of SVT, she was referred to EP for further evaluation.  She presents today for follow-up.  Since her last visit she has been stable from a cardiac standpoint.  She denies any recent palpitations, dizziness, presyncope, syncope, dyspnea, chest pain, edema, PND, orthopnea, weight gain.  She did not start taking verapamil as she states her episodes of SVT have been so infrequent she did not feel she needed a daily medication.  Additionally, she is planning on canceling her appointment with the EP as she feels it is not necessary at this time.  Overall, she reports feeling well.  Home Medications    No current outpatient medications on file.  No current facility-administered medications for this visit.     Review of Systems    She denies chest pain, palpitations, dyspnea, pnd, orthopnea, n, v, dizziness, syncope, edema, weight gain, or early satiety. All other systems reviewed and are otherwise negative except as noted above.   Physical Exam    VS:  BP 122/68 (BP Location: Left Arm, Patient Position: Sitting, Cuff Size: Large)   Pulse 83   Ht 5'  7" (1.702 m)   Wt 209 lb (94.8 kg)   LMP 03/26/2018 (Exact Date)   BMI 32.73 kg/m  GEN: Well nourished, well developed, in no acute distress. HEENT: normal. Neck: Supple, no JVD, carotid bruits, or masses. Cardiac: RRR, no murmurs, rubs, or gallops. No clubbing, cyanosis, edema.  Radials/DP/PT 2+ and equal bilaterally.  Respiratory:  Respirations regular and unlabored, clear to auscultation bilaterally. GI: Soft, nontender, nondistended, BS + x 4. MS: no deformity or atrophy. Skin: warm and dry, no rash. Neuro:  Strength and sensation are intact. Psych: Normal affect.  Accessory Clinical Findings    ECG personally reviewed by me today - No EKG in office today.    Lab Results  Component Value Date   WBC 9.3 05/21/2022   HGB 13.6 05/21/2022   HCT 42.4 05/21/2022   MCV 90.4 05/21/2022   PLT 271 05/21/2022   Lab Results  Component Value Date   CREATININE 0.65 06/05/2022   BUN 15 06/05/2022   NA 143 06/05/2022   K 4.8 06/05/2022   CL 106 06/05/2022   CO2 25 06/05/2022   Lab Results  Component Value Date   ALT 15 06/05/2022   AST 13 06/05/2022   ALKPHOS 86 06/05/2022   BILITOT 0.3 06/05/2022   Lab Results  Component Value Date   CHOL 169 06/05/2022   HDL 58 06/05/2022   LDLCALC 96 06/05/2022   TRIG 78 06/05/2022   CHOLHDL 2.9 06/05/2022    No results found for: "HGBA1C"  Assessment & Plan    1. SVT: Longstanding history of SVT dating back to childhood.  She has had infrequent episodes (states they have occurred approximately every 10 years).  She denies any recent palpitations.  She was started on verapamil, however, she never started taking this as she felt it was not needed given infrequency of symptoms. She was also referred to EP, however, patient declines EP referral at this time.  Discussed ED precautions, vagal maneuvers. Continue to monitor symptoms.  2. Mitral valve regurgitation: Trivial on most recent echocardiogram.  She does have a reported history of  mitral valve prolapse, however, this was not appreciated on most recent echo.  Euvolemic and well compensated on exam.  Plan for repeat echocardiogram as clinically indicated.  3. Obesity: Encouraged ongoing lifestyle modifications with diet and exercise.  4. Disposition: Follow-up in 4-6 months with Dr. Gwenlyn Found.     Lenna Sciara, NP 07/13/2022, 8:47 AM

## 2022-08-03 ENCOUNTER — Institutional Professional Consult (permissible substitution): Payer: BC Managed Care – PPO | Admitting: Cardiovascular Disease

## 2022-11-11 ENCOUNTER — Ambulatory Visit: Payer: 59 | Attending: Cardiovascular Disease | Admitting: Cardiovascular Disease

## 2024-06-13 ENCOUNTER — Telehealth: Payer: Self-pay

## 2024-06-13 NOTE — Telephone Encounter (Signed)
"  ° °  Pre-operative Risk Assessment    Patient Name: Sue Shields  DOB: 1965-09-23 MRN: [id number]   Date of last office visit: 07/13/22 Date of next office visit: Not scheduled   Request for Surgical Clearance    Procedure:  Colonoscopy  Date of Surgery:  Clearance 07/21/24                                Surgeon:  Dr. Kristie Socks Group or Practice Name:  [facility] Phone number:  [phone] Fax number:  [phone]   Type of Clearance Requested:   - Medical    Type of Anesthesia:  Propofol   Additional requests/questions:    Bonney Ival LOISE Gerome   06/13/2024, 5:10 PM   "

## 2024-06-14 NOTE — Telephone Encounter (Signed)
 OV preop clearance appt now scheduled

## 2024-06-14 NOTE — Telephone Encounter (Signed)
" ° °  Name: Sue Shields  DOB: July 05, 1965  MRN: [id number]  Primary Cardiologist: Dorn Lesches, MD  Chart reviewed as part of pre-operative protocol coverage. Because of [name]'s past medical history and time since last visit, she will require a follow-up in-office visit in order to better assess preoperative cardiovascular risk. She was due for 6 months follow up (June 2025) with Dr.Berry. No appointment was seen for her. Should be seen for clearance as she was last seen in January 2025 by [name], DNP. Procedure scheduled for 07/21/2024.  Pre-op covering staff: - Please schedule appointment and call patient to inform them. If patient already had an upcoming appointment within acceptable timeframe, please add pre-op clearance to the appointment notes so provider is aware. - Please contact requesting surgeon's office via preferred method (i.e, phone, fax) to inform them of need for appointment prior to surgery.    Lamarr Satterfield, NP  06/14/2024, 7:50 AM   "

## 2024-06-26 NOTE — Progress Notes (Unsigned)
 " Cardiology Office Note   Date:  07/05/2024  ID:  Sue Shields, Sue Shields 10-Sep-1965, MRN [id number] PCP: Okey Carlin Redbird, MD  [facility] Providers Cardiologist:  Dorn Lesches, MD     PMH SVT Mitral valve prolapse/regurgitation Obesity  Longstanding history of SVT dating back to childhood with occasional episodes of brief increased heart rate.  She was told she had mitral valve prolapse around 1988 during a prior cardiac evaluation.  She presented to urgent care 05/2022 with elevated heart rate.  She responded to IV metoprolol  and was referred to cardiology.  Was seen in the office 06/01/2022 felt to be stable from cardiac standpoint, started on verapamil .  Echocardiogram revealed EF 55 to 60%, normal LV function, normal RV systolic function, trivial mitral valve regurgitation.  Given longstanding history of SVT, she was referred to EP for further evaluation.  Last cardiology clinic visit was 07/13/2022 with [name], NP.  She did not start verapamil  as she felt episodes of SVT had been infrequent and not requiring daily medication.  Additionally, she was planning on canceling her appointment with EP as she felt it was not necessary at that time.  Overall she reported feeling well and had no concerning cardiac symptoms.  History of Present Illness Sue Shields is a 59 y.o. female who is here today for preoperative cardiac evaluation for upcoming colonoscopy Discussed the use of AI scribe software for clinical note transcription with the patient, who gave verbal consent to proceed.  History of Present Illness Sue Shields is a very pleasant 59 year old female who presents today for preoperative cardiac evaluation.She has history of supraventricular tachycardia with brief episodes that rarely occur. Most recent episode approximately 2 years ago. She has never had issues with SVT during prior surgeries or anesthesia. Was told she had mitral valve prolapse 35 years  ago, but a 2024 echocardiogram showed no prolapse and only trivial valvular regurgitation. She is not on cardiac medications and denies presyncope, lightheadedness, or fatigue. Blood pressure is usually 110-115/70 without symptoms of orthostatic hypotension. She walks about half a mile and rides a bike, limited mainly by her knees. She denies chest pain, dyspnea, orthopnea, PND, edema.  She has no family history of heart disease. Recent blood work was done, with results not yet available.   ROS: See HPI  Studies Reviewed EKG Interpretation Date/Time:  Monday July 03 2024 14:19:05 EST Ventricular Rate:  69 PR Interval:  138 QRS Duration:  94 QT Interval:  414 QTC Calculation: 443 R Axis:   13  Text Interpretation: Normal sinus rhythm Normal ECG When compared with ECG of 21-May-2022 11:12, PREVIOUS ECG IS PRESENT Confirmed by Percy Browning [phone]) on 07/03/2024 2:23:11 PM     No results found for: LIPOA  Risk Assessment/Calculations      Physical Exam VS:  BP 122/70 (BP Location: Left Arm, Patient Position: Sitting, Cuff Size: Normal)   Pulse 69   Ht 5' 7 (1.702 m)   Wt 214 lb (97.1 kg)   LMP 03/26/2018   SpO2 97%   BMI 33.52 kg/m    Wt Readings from Last 3 Encounters:  07/03/24 214 lb (97.1 kg)  07/13/22 209 lb (94.8 kg)  06/01/22 200 lb (90.7 kg)    GEN: Well nourished, well developed in no acute distress NECK: No JVD; No carotid bruits CARDIAC: RRR, no murmurs, rubs, gallops RESPIRATORY:  Clear to auscultation without rales, wheezing or rhonchi  ABDOMEN: Soft, non-tender, non-distended EXTREMITIES:  No edema;  No deformity    Assessment & Plan Preoperative cardiovascular evaluation   Scheduled for upcoming colonoscopy. No concerning cardiac symptoms presently. According to the Revised Cardiac Risk Index (RCRI), her Perioperative Risk of Major Cardiac Event is (%): 0.4. Her Functional Capacity in METs is: 6.61 according to the Duke Activity Status Index (DASI).  The patient is doing well from a cardiac perspective. Therefore, based on ACC/AHA guidelines, the patient would be at acceptable risk for the planned procedure without further cardiovascular testing. Advised pt to notify us  if she develops concerning cardiac symptoms prior to procedure.  - No cardiac medications to hold - Will forward clearance to requesting provider  Paroxysmal SVT  Palpitations  Infrequent SVT with no recent episodes or symptoms.  Is not on AV nodal blocking agents.  No indication for further testing at this time. - Continue to monitor clinically for now  Mitral valve regurgitation   Reviewed echo from 06/2022 that revealed MV normal in structure with trivial MR. She was previously advised she had mitral valve prolapse.  Advise no evidence of prolapse on most recent echo.  She is asymptomatic with no murmur noted on exam.   - Continue to follow clinically at this time  Cardiac risk We discussed lipid testing and potential CT calcium scoring for further risk stratification.  She had recent lab work with gynecologist.  Advised LDL goal 100 or lower given age and additional risk factor of obesity. -Consider CT calcium score - Heart healthy diet avoiding processed foods, saturated fat, sugar, and other simple carbohydrates encouraged -Be as physically active as possible every day and aim for at least 150 minutes of moderate intensity exercise each week         Dispo: 1 year with Dr. Court or APP  Signed, Rosaline Bane, NP-C "

## 2024-07-03 ENCOUNTER — Ambulatory Visit (INDEPENDENT_AMBULATORY_CARE_PROVIDER_SITE_OTHER): Admitting: Nurse Practitioner

## 2024-07-03 ENCOUNTER — Encounter (HOSPITAL_BASED_OUTPATIENT_CLINIC_OR_DEPARTMENT_OTHER): Payer: Self-pay | Admitting: Nurse Practitioner

## 2024-07-03 VITALS — BP 122/70 | HR 69 | Ht 67.0 in | Wt 214.0 lb

## 2024-07-03 DIAGNOSIS — I471 Supraventricular tachycardia, unspecified: Secondary | ICD-10-CM | POA: Diagnosis not present

## 2024-07-03 DIAGNOSIS — I340 Nonrheumatic mitral (valve) insufficiency: Secondary | ICD-10-CM

## 2024-07-03 DIAGNOSIS — Z7189 Other specified counseling: Secondary | ICD-10-CM

## 2024-07-03 DIAGNOSIS — R002 Palpitations: Secondary | ICD-10-CM | POA: Diagnosis not present

## 2024-07-03 DIAGNOSIS — Z01810 Encounter for preprocedural cardiovascular examination: Secondary | ICD-10-CM

## 2024-07-03 NOTE — Patient Instructions (Signed)
 Medication Instructions:  Your physician recommends that you continue on your current medications as directed. Please refer to the Current Medication list given to you today.  *If you need a refill on your cardiac medications before your next appointment, please call your pharmacy*  Lab Work: None Ordered If you have labs (blood work) drawn today and your tests are completely normal, you will receive your results only by: MyChart Message (if you have MyChart) OR A paper copy in the mail If you have any lab test that is abnormal or we need to change your treatment, we will call you to review the results.  Testing/Procedures: None Ordered  Follow-Up: At [facility], you and your health needs are our priority.  As part of our continuing mission to provide you with exceptional heart care, our providers are all part of one team.  This team includes your primary Cardiologist (physician) and Advanced Practice Providers or APPs (Physician Assistants and Nurse Practitioners) who all work together to provide you with the care you need, when you need it.  Your next appointment:   As needed  Provider:   Dorn Lesches, MD    We recommend signing up for the patient portal called MyChart.  Sign up information is provided on this After Visit Summary.  MyChart is used to connect with patients for Virtual Visits (Telemedicine).  Patients are able to view lab/test results, encounter notes, upcoming appointments, etc.  Non-urgent messages can be sent to your provider as well.   To learn more about what you can do with MyChart, go to [email or web address].   Other Instructions  Adopting a Healthy Lifestyle.   Weight: Know what a healthy weight is for you (roughly BMI <25) and aim to maintain this. You can calculate your body mass index on your smart phone. Unfortunately, this is not the most accurate measure of healthy weight, but it is the simplest measurement to use. A more accurate  measurement involves body scanning which measures lean muscle, fat tissue and bony density. We do not have this equipment at [facility].    Diet: Aim for 7+ servings of fruits and vegetables daily Limit animal fats in diet for cholesterol and heart health - choose grass fed whenever available Avoid highly processed foods (fast food burgers, tacos, fried chicken, pizza, hot dogs, french fries)  Saturated fat comes in the form of butter, lard, coconut oil, margarine, partially hydrogenated oils, dairy products, and fat in meat. These increase your risk of cardiovascular disease.  Use healthy plant oils, such as olive, canola, soy, corn, sunflower and peanut.  Whole foods such as fruits, vegetables and whole grains have fiber  Men need > 38 grams of fiber per day Women need > 25 grams of fiber per day  Load up on vegetables and fruits - one-half of your plate: Aim for color and variety, and remember that potatoes dont count. Go for whole grains - one-quarter of your plate: Whole wheat, barley, wheat berries, quinoa, oats, brown rice, and foods made with them. If you want pasta, go with whole wheat pasta. Protein power - one-quarter of your plate: Fish, chicken, beans, and nuts are all healthy, versatile protein sources. Limit red meat. You need carbohydrates for energy! The type of carbohydrate is more important than the amount. Choose carbohydrates such as vegetables, fruits, whole grains, beans, and nuts in the place of white rice, white pasta, potatoes (baked or fried), macaroni and cheese, cakes, cookies, and donuts.  If youre thirsty, drink water.  Coffee and tea are good in moderation, but skip sugary drinks and limit milk and dairy products to one or two daily servings. Keep sugar intake at 6 teaspoons or 24 grams or LESS       Exercise: Aim for 150 min of moderate intensity exercise weekly for heart health, and weights twice weekly for bone health Stay active - any steps are better than no  steps! Aim for 7-9 hours of sleep daily   Sleep: This provides your body with the reset and relaxation that it needs!  Aim to get 7-8 hours of sleep each night. Limit caffeine, screen time, and other distractions prior to bedtime.  Keep your bedroom cool and dark and do not wear heavy clothing to bed or use heavy bed covers - layer if needed.

## 2024-07-04 NOTE — Telephone Encounter (Signed)
 Requesting office sent duplicate inquiring if pt has been cleared.

## 2024-07-05 ENCOUNTER — Encounter (HOSPITAL_BASED_OUTPATIENT_CLINIC_OR_DEPARTMENT_OTHER): Payer: Self-pay | Admitting: Nurse Practitioner
